# Patient Record
Sex: Female | Born: 1995 | Race: White | Hispanic: No | Marital: Single | Smoking: Never smoker
Health system: Southern US, Community
[De-identification: ages and names within clinical notes are randomized; demographics above are authoritative.]

## PROBLEM LIST (undated history)

## (undated) DIAGNOSIS — G9389 Other specified disorders of brain: Secondary | ICD-10-CM

## (undated) DIAGNOSIS — R569 Unspecified convulsions: Secondary | ICD-10-CM

## (undated) DIAGNOSIS — G43909 Migraine, unspecified, not intractable, without status migrainosus: Secondary | ICD-10-CM

## (undated) HISTORY — PX: NO PAST SURGERIES: SHX2092

---
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
## 2017-01-27 ENCOUNTER — Emergency Department (HOSPITAL_COMMUNITY)
Admission: EM | Admit: 2017-01-27 | Discharge: 2017-01-27 | Disposition: A | Discharge status: Home / Self Care | Payer: Medicaid Other | Attending: Emergency Medicine | Admitting: Emergency Medicine

## 2017-01-27 ENCOUNTER — Emergency Department (HOSPITAL_COMMUNITY): Payer: Medicaid Other

## 2017-01-27 ENCOUNTER — Encounter (HOSPITAL_COMMUNITY): Payer: Self-pay | Admitting: Emergency Medicine

## 2017-01-27 DIAGNOSIS — Z79899 Other long term (current) drug therapy: Secondary | ICD-10-CM | POA: Diagnosis not present

## 2017-01-27 DIAGNOSIS — Z7722 Contact with and (suspected) exposure to environmental tobacco smoke (acute) (chronic): Secondary | ICD-10-CM | POA: Diagnosis not present

## 2017-01-27 DIAGNOSIS — R569 Unspecified convulsions: Secondary | ICD-10-CM | POA: Insufficient documentation

## 2017-01-27 MED ORDER — TOPIRAMATE 25 MG PO TABS
150.0000 mg | ORAL_TABLET | Freq: Once | ORAL | Status: AC
  Administered 2017-01-27: 150 mg via ORAL
  Filled 2017-01-27: qty 1

## 2017-01-27 MED ORDER — TOPIRAMATE ER 150 MG PO SPRINKLE CAP24: 150.0000 mg | EXTENDED_RELEASE_CAPSULE | Freq: Two times a day (BID) | ORAL | 1 refills | Status: DC

## 2017-01-27 MED ORDER — LORAZEPAM 1 MG PO TABS
1.0000 mg | ORAL_TABLET | Freq: Once | ORAL | Status: AC
  Administered 2017-01-27: 1 mg via ORAL
  Filled 2017-01-27: qty 1

## 2017-01-27 MED ORDER — ONDANSETRON HCL 4 MG/2ML IJ SOLN
4.0000 mg | Freq: Once | INTRAMUSCULAR | Status: DC
  Filled 2017-01-27: qty 2

## 2017-01-27 MED ORDER — ONDANSETRON 4 MG PO TBDP
4.0000 mg | ORAL_TABLET | Freq: Once | ORAL | Status: AC
  Administered 2017-01-27: 4 mg via ORAL
  Filled 2017-01-27: qty 1

## 2017-01-27 MED ORDER — IBUPROFEN 200 MG PO TABS
400.0000 mg | ORAL_TABLET | Freq: Once | ORAL | Status: AC
  Administered 2017-01-27: 400 mg via ORAL
  Filled 2017-01-27: qty 2

## 2017-01-27 MED ORDER — LORAZEPAM 2 MG/ML IJ SOLN
1.0000 mg | INTRAMUSCULAR | Status: DC | PRN
  Filled 2017-01-27: qty 1

## 2017-01-27 NOTE — Discharge Instructions (Signed)
Continue current medications. 

## 2017-01-27 NOTE — ED Notes (Signed)
Pt wheeled to vehicle.  Ambulatory and independent at discharge.  Verbalized understanding of discharge instructions.   

## 2017-01-27 NOTE — ED Notes (Signed)
2 IV start attempts made by RN

## 2017-01-27 NOTE — ED Triage Notes (Signed)
Per EMS, pt c/o 1 grand-mal type seizure lasting 2-3 minutes about 1 hour ago, followed by mild post-ictal confusion. 5 mg Versed administered IM by family ended seizure. Struck head during fall, c/o headache now, 3 episodes emesis today. Takes levitracitam, which is new prescription. Also takes  Qudexy SR for seizures, which she has been out of and not taking x 3-4 days. Hx seizures, last seizure about 1 year ago.

## 2017-01-27 NOTE — ED Notes (Signed)
Patient transported to CT 

## 2017-01-27 NOTE — ED Provider Notes (Signed)
[facility]-EMERGENCY DEPT Provider Note   CSN: [id number] Arrival date & time: 01/27/17  1057     History   Chief Complaint Chief Complaint  Patient presents with  . Seizures    HPI Tamara [name] is a 21 y.o. female.  Chief complaint is seizure  HPI:  This is a 21 year old female with history of seizure disorder. Has been out of her topiramate for the last 4-5 days Pradaxa states she has it, but just hasn't restarted it. Also history of Keppra use which she has been compliant with. Had a seizure this morning. Larey SeatFell and hit her head. Also has history of AMI clonus that has improved slowly through the morning. This was present this morning and caused her to fall and hit her head prior to her seizure. Her myoclonus has improved.  Past Medical History:  Diagnosis Date  . Brain mass   . Migraines   . Seizures [facility](HCC)     Patient Active Problem List   Diagnosis Date Noted  . 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 Migraine without aura and without status migrainosus, not intractable 08/29/2014  . Episodic tension-type headache 08/29/2014  . Single epileptic seizure (HCC) 08/29/2014  . Myoclonus 08/29/2014  . Arnold-Chiari malformation, type I (HCC) 07/24/2014  . Generalized headaches 07/24/2014  . Vertigo of central origin 07/24/2014  . Obesity 07/24/2014    History reviewed. No pertinent surgical history.  OB History    No data available       Home Medications    Prior to Admission medications   Medication Sig Start Date End Date Taking? Authorizing Provider  levETIRAcetam (KEPPRA) 250 MG tablet Take 1 tablet twice daily 10/22/16   Deetta PerlaHickling, William H, MD  magic mouthwash SOLN Take 5 mLs by mouth 3 (three) times daily as needed for mouth pain. 03/20/15   Deetta PerlaHickling, William H, MD  midazolam (VERSED) 5 MG/ML injection Place 2 mLs (10 mg total) into the nose once. Draw up 1 mL each in 2 syringes, remove syringe from the  blue vial access device, then attach syringe to nasal atomizer for intranasal administration. Give 1 mL in each nostril for seizures lasting 2 minutes or longer. 03/20/15   Deetta PerlaHickling, William H, MD  SUMAtriptan (IMITREX) 25 MG tablet Take one tablet at onset of migraine; May repeat in 2 hours if headache persists or recurs. 02/12/16   Deetta PerlaHickling, William H, MD  Topiramate ER (QUDEXY XR) 150 MG CS24 Take 150 mg by mouth 2 (two) times daily. 01/27/17   Rolland PorterJames, Perley Arthurs, MD    Family History Family History  Problem Relation Age of Onset  . Heart attack Maternal Grandfather        Died at 4263  . Cancer Paternal Grandmother        Age at time of death is unknown    Social History Social History  Substance Use Topics  . Smoking status: Passive Smoke Exposure - Never Smoker  . Smokeless tobacco: Never Used  . Alcohol use No     Allergies   Patient has no known allergies.   Review of Systems Review of Systems  Constitutional: Negative for appetite change, chills, diaphoresis, fatigue and fever.  HENT: Negative for mouth sores, sore throat and trouble swallowing.   Eyes: Negative for visual disturbance.  Respiratory: Negative for cough, chest tightness, shortness of breath and wheezing.   Cardiovascular: Negative for chest pain.  Gastrointestinal: Negative for abdominal distention, abdominal pain, diarrhea, nausea  and vomiting.  Endocrine: Negative for polydipsia, polyphagia and polyuria.  Genitourinary: Negative for dysuria, frequency and hematuria.  Musculoskeletal: Negative for gait problem.  Skin: Negative for color change, pallor and rash.  Neurological: Positive for seizures and headaches. Negative for dizziness, syncope and light-headedness.  Hematological: Does not bruise/bleed easily.  Psychiatric/Behavioral: Negative for behavioral problems and confusion.     Physical Exam Updated Vital Signs BP (!) 101/57 (BP Location: Left Arm)   Pulse 60   Temp (!) 97.5 F (36.4 C) (Oral)    Resp 16   LMP 01/26/2017   SpO2 100%   Physical Exam  Constitutional: She is oriented to person, place, and time. She appears well-developed and well-nourished. No distress.  HENT:  Head: Normocephalic.  Eyes: Conjunctivae are normal. Pupils are equal, round, and reactive to light. No scleral icterus.  Neck: Normal range of motion. Neck supple. No thyromegaly present.  Cardiovascular: Normal rate and regular rhythm.  Exam reveals no gallop and no friction rub.   No murmur heard. Pulmonary/Chest: Effort normal and breath sounds normal. No respiratory distress. She has no wheezes. She has no rales.  Abdominal: Soft. Bowel sounds are normal. She exhibits no distension. There is no tenderness. There is no rebound.  Musculoskeletal: Normal range of motion.  Neurological: She is alert and oriented to person, place, and time.  Awake and alert. Oriented. Normal cranial nerve exam. Normal gait. Normal strength and sensation.  Skin: Skin is warm and dry. No rash noted.  Psychiatric: She has a normal mood and affect. Her behavior is normal.     ED Treatments / Results  Labs (all labs ordered are listed, but only abnormal results are displayed) Labs Reviewed - No data to display  EKG  EKG Interpretation None       Radiology Ct Head Wo Contrast  Result Date: 01/27/2017 CLINICAL DATA:  Grand mal seizure 1 hour ago, postictal. Struck head during fall. Headache and vomiting. History of seizures and brain mass. EXAM: CT HEAD WITHOUT CONTRAST TECHNIQUE: Contiguous axial images were obtained from the base of the skull through the vertex without intravenous contrast. COMPARISON:  MRI of the head August 16, 2016 FINDINGS: BRAIN: No intraparenchymal hemorrhage, mass effect nor midline shift. The ventricles and sulci are normal. No acute large vascular territory infarcts. No abnormal extra-axial fluid collections. Basal cisterns are patent. Minimal cerebellar tonsillar ectopia, similar to prior  examination. VASCULAR: Unremarkable. SKULL/SOFT TISSUES: No skull fracture. No significant soft tissue swelling. ORBITS/SINUSES: The included ocular globes and orbital contents are normal.The mastoid aircells and included paranasal sinuses are well-aerated. OTHER: None. IMPRESSION: 1. No acute intracranial process. 2. Minimal cerebellar tonsillar ectopia ; otherwise negative noncontrast CT HEAD. Electronically Signed   By: Awilda Metro M.D.   On: 01/27/2017 13:36    Procedures Procedures (including critical care time)  Medications Ordered in ED Medications  ibuprofen (ADVIL,MOTRIN) tablet 400 mg (400 mg Oral Given 01/27/17 1249)  topiramate (TOPAMAX) tablet 150 mg (150 mg Oral Given 01/27/17 1249)  ondansetron (ZOFRAN-ODT) disintegrating tablet 4 mg (4 mg Oral Given 01/27/17 1403)  LORazepam (ATIVAN) tablet 1 mg (1 mg Oral Given 01/27/17 1402)     Initial Impression / Assessment and Plan / ED Course  I have reviewed the triage vital signs and the nursing notes.  Pertinent labs & imaging results that were available during my care of the patient were reviewed by me and considered in my medical decision making (see chart for details).    CT scan  shows no acute findings. Labs reassuring. Given her dose of her topiramate here. Given 1 mg Ativan for seizure protection today. Continue/resume her topiramate. F/U with her neurologist.  Final Clinical Impressions(s) / ED Diagnoses   Final diagnoses:  Seizure (HCC)    New Prescriptions New Prescriptions   TOPIRAMATE ER (QUDEXY XR) 150 MG CS24    Take 150 mg by mouth 2 (two) times daily.     Rolland Porter, MD 01/27/17 [phone]

## 2017-03-03 IMAGING — MR MR HEAD W/O CM
9 series · 48 of 48 positions shown · non-contrast
Comparison: 07/28/2014 MRI of the head.

CLINICAL DATA: 20 y/o F; history of seizures, headaches with neck
pain, and involuntary jerking since [DATE].

EXAM:
MRI HEAD WITHOUT CONTRAST
TECHNIQUE: Multiplanar, multiecho pulse sequences of the brain and surrounding
structures were obtained without intravenous contrast.

[Series 5: T1 · sagittal · 4.0mm · 0.75mm/px · 2 of 26 slices shown (1 of 2)]
[im 1/26]
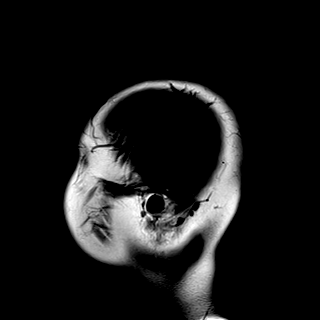
[im 26/26]
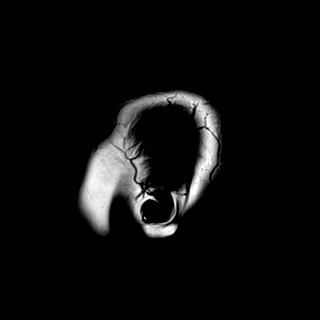

[Series 6: DWI · axial · 3.0mm · 1.44mm/px · z∈[-83,+51]mm · 8 of 84 slices shown (1 of 2)]
[im 1/84]
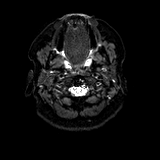
[im 12/84]
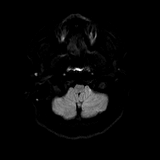
[im 24/84]
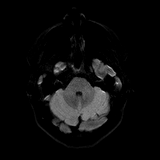
[im 36/84]
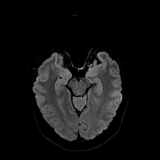
[im 48/84]
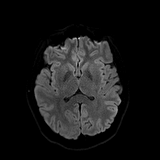
[im 60/84]
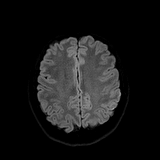
[im 72/84]
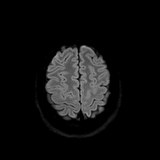
[im 84/84]
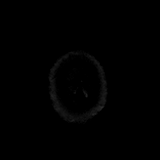

[Series 7: DWI · axial · 3.0mm · 1.44mm/px · z∈[-83,+51]mm · 4 of 42 slices shown (2 of 2)]
[im 1/42]
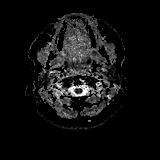
[im 14/42]
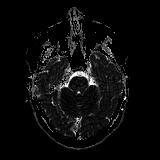
[im 28/42]
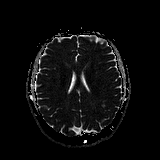
[im 42/42]
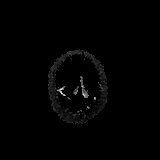

[Series 8: T2 · axial · 4.0mm · 0.36mm/px · z∈[-85,+54]mm · 3 of 28 slices shown (1 of 3)]
[im 1/28]
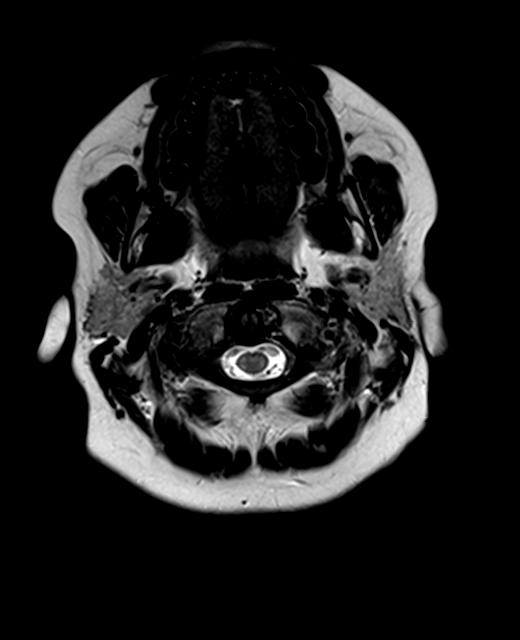
[im 14/28]
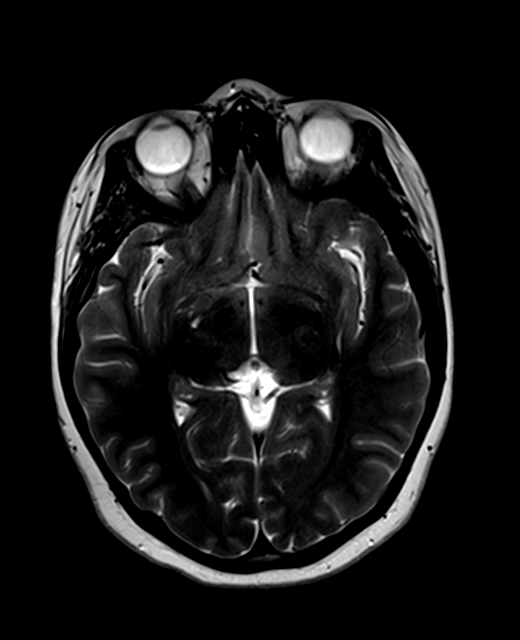
[im 28/28]
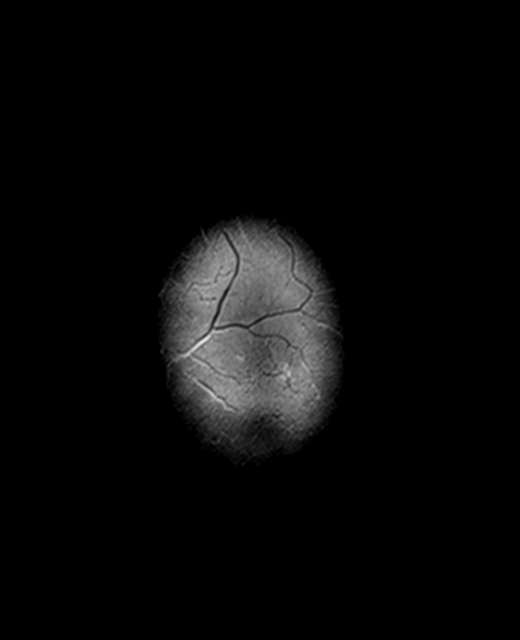

[Series 9: FLAIR · axial · 4.0mm · 0.72mm/px · z∈[-87,+53]mm · 3 of 28 slices shown]
[im 1/28]
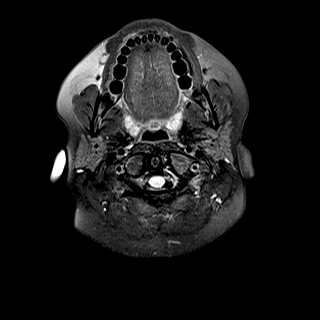
[im 14/28]
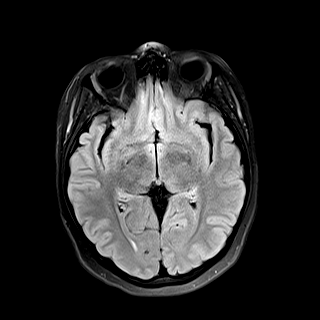
[im 28/28]
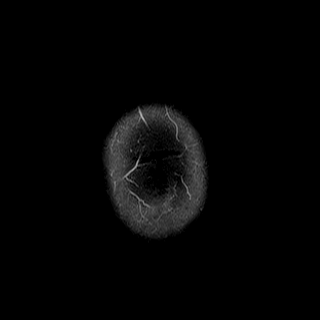

[Series 11: swi_images · axial · 1.5mm · 0.90mm/px · z∈[-86,+55]mm · 9 of 96 slices shown]
[im 1/96]
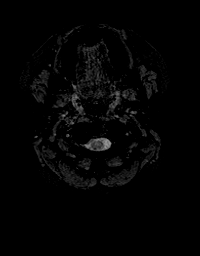
[im 12/96]
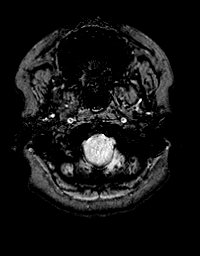
[im 24/96]
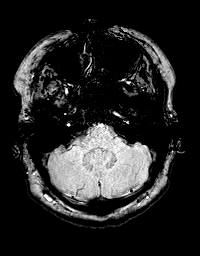
[im 36/96]
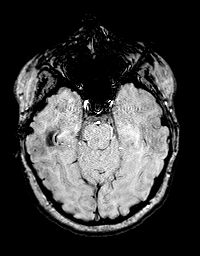
[im 48/96]
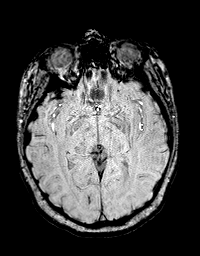
[im 60/96]
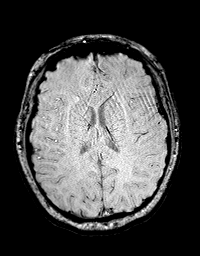
[im 72/96]
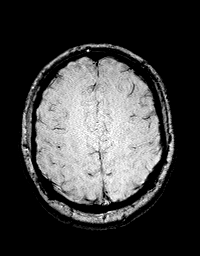
[im 84/96]
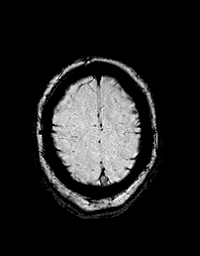
[im 96/96]
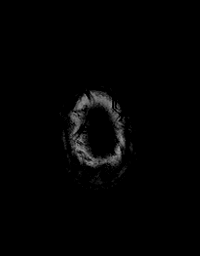

[Series 12: T1 · axial · 1.0mm · 0.90mm/px · z∈[-88,+55]mm · 14 of 144 slices shown (2 of 2)]
[im 1/144]
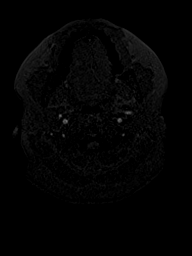
[im 12/144]
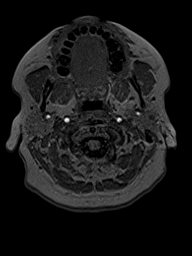
[im 23/144]
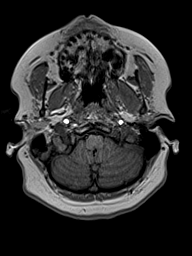
[im 34/144]
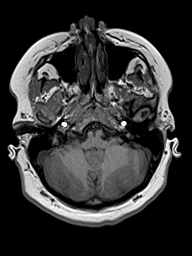
[im 45/144]
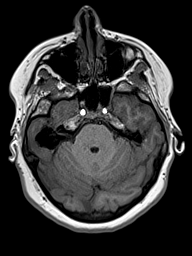
[im 56/144]
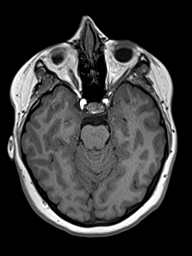
[im 67/144]
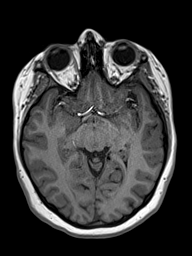
[im 78/144]
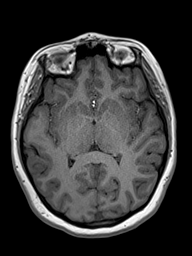
[im 89/144]
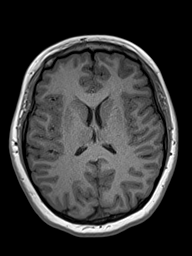
[im 100/144]
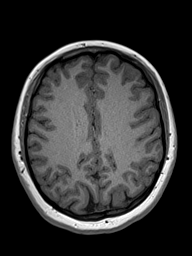
[im 111/144]
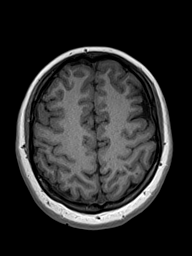
[im 122/144]
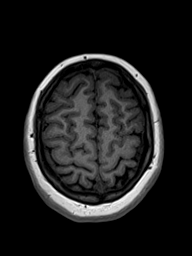
[im 133/144]
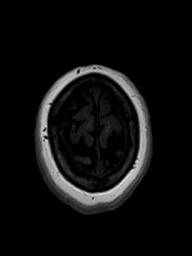
[im 144/144]
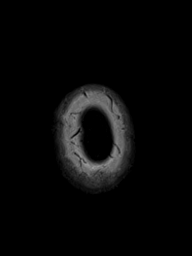

[Series 13: T2 · coronal · 4.5mm · 0.36mm/px · 3 of 30 slices shown (2 of 3)]
[im 1/30]
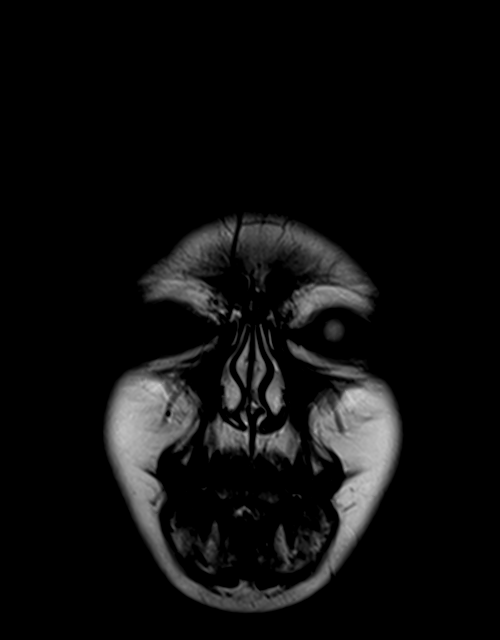
[im 15/30]
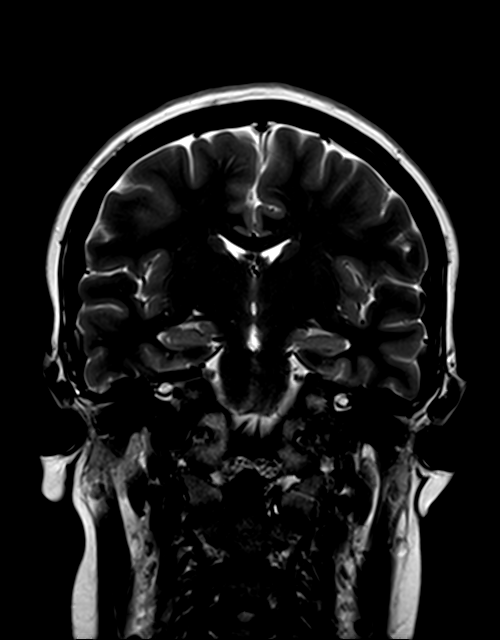
[im 30/30]
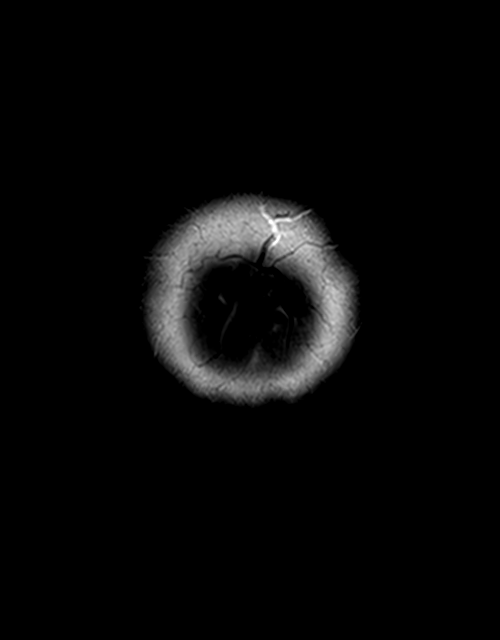

[Series 14: T2 · coronal · 3.0mm · 0.47mm/px · 2 of 25 slices shown (3 of 3)]
[im 1/25]
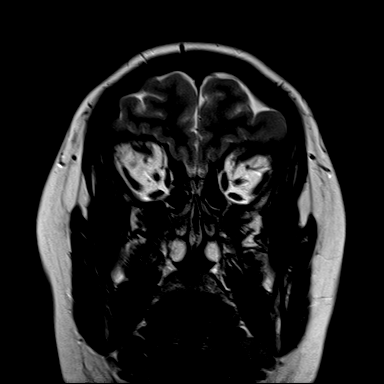
[im 25/25]
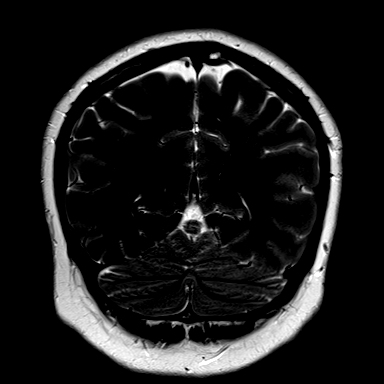

[48 of 48 positions shown; findings below may reference images not displayed]

FINDINGS: Brain: Cerebellar tonsillar ectopia of 4.6 mm, previously 3.2 mm
measured in a similar fashion. This is stable to minimally increased
given differences in slice selection. The tonsils have a rounded and
not pointed appearance and there is no hydrocephalus or mass effect
at the foramen magnum.

No diffusion signal abnormality. No extra-axial collection,
hydrocephalus, focal mass effect, or hemorrhage. No significant T2
FLAIR signal abnormality. No disorder cortical formation,
heterotopia, or structural abnormality is identified. Hippocampi by
are symmetric in size and signal bilaterally. Complete corpus
callosum and vermis.

Vascular: Normal flow voids.

Skull and upper cervical spine: Normal marrow signal.

Sinuses/Orbits: Negative.

Other: None.
IMPRESSION: 1. 4-5 mm tonsillar ectopia, previously measuring 3 mm. This is
stable to minimally increased given differences in slice selection.
No significant mass effect at the foramen magnum. No hydrocephalus.
2. Otherwise normal MRI of the brain.
3. No structural cause of seizure identified.

By: Noriel Noriel M.D.

## 2017-03-31 ENCOUNTER — Ambulatory Visit (INDEPENDENT_AMBULATORY_CARE_PROVIDER_SITE_OTHER): Payer: Medicaid Other | Admitting: Pediatrics

## 2017-03-31 ENCOUNTER — Encounter (INDEPENDENT_AMBULATORY_CARE_PROVIDER_SITE_OTHER): Payer: Self-pay | Admitting: Pediatrics

## 2017-03-31 VITALS — BP 110/90 | HR 60 | Ht 67.5 in | Wt 203.0 lb

## 2017-03-31 DIAGNOSIS — G44219 Episodic tension-type headache, not intractable: Secondary | ICD-10-CM

## 2017-03-31 DIAGNOSIS — G253 Myoclonus: Secondary | ICD-10-CM | POA: Diagnosis not present

## 2017-03-31 DIAGNOSIS — Q048 Other specified congenital malformations of brain: Secondary | ICD-10-CM

## 2017-03-31 DIAGNOSIS — G40309 Generalized idiopathic epilepsy and epileptic syndromes, not intractable, without status epilepticus: Secondary | ICD-10-CM

## 2017-03-31 DIAGNOSIS — G43009 Migraine without aura, not intractable, without status migrainosus: Secondary | ICD-10-CM

## 2017-03-31 MED ORDER — QUDEXY XR 100 MG PO CS24: 1.0000 | EXTENDED_RELEASE_CAPSULE | Freq: Two times a day (BID) | ORAL | 5 refills | Status: DC

## 2017-03-31 MED ORDER — LEVETIRACETAM 250 MG PO TABS: ORAL_TABLET | ORAL | 5 refills | Status: DC

## 2017-03-31 MED ORDER — QUDEXY XR 50 MG PO CS24: EXTENDED_RELEASE_CAPSULE | ORAL | 5 refills | Status: DC

## 2017-03-31 NOTE — Progress Notes (Deleted)
HPI: .      Plan:       

## 2017-03-31 NOTE — Patient Instructions (Signed)
Currently you take Qudexy 50 mg and 100 mg in the morning and evening.  In order to try to move to one dose per day I want you to try the following:  Take a total 100 mg in the morning and 200 mg at nighttime.  I would do this by continuing 100 mg in the morning and taking 100 mg plus 2 -  50 mg at nighttime for a week.  If things go well, then I would take 50 mg in the morning, and 250 mg at nighttime using 2-100 mg tablets and one 50 mg tablet.  Finally if things go well you can take 2 100 mg tablets and 2 50 mg tablets at bedtime.  Once he use up the 50s, I will switch to 3 100 mg tablets , or figure out another combination that will equal 300.  Please send me your calendars that have been completed.  Send me September calendar at the end of the month.  I'm very proud of you that you have lost weight again.  Please keep up the good work with wise traces in food, curbing your intake, and physical activity.

## 2017-03-31 NOTE — Progress Notes (Signed)
Patient: Tamara [name] MRN: [id number] Sex: female DOB: [birth year]  Provider: Ellison CarwinWilliam Merlin Golden, MD [facility]  Note type: Routine return visit  History of Present Illness: Referral Source: Rowan BlaseMeghan Hall, MD History from: mother, patient and [facility] chart Chief Complaint: Headaches/Epilepsy  Tamara BarbaraChelsea M [name] is a 21 y.o. female who returns for evaluation of seizures, migraines, and myoclonus.  She notes her last seizure was around July 10. She drank some of her sister-in-laws alcoholic drink the day before and was getting ready to take her medication the next morning but felt jerky then became dizzy and had a tonic clonic seizure. Mom provided a dose of midazolam nasally. The seizure lasted about 3 minutes. She currently notes she is not having much myoclonus at this time.  [name] is having 2-3 headaches a week, she describes as medium and none have forced her to bed. She is currently completing high school classes online to finish her high school diploma. She hopes to complete this in 2019. She would like to switch her topiramate extended release to once daily if possible to simplify her daily medication regimen. Mom notes since starting keppra, she has noted some increased bruising but she is not sure if this related to the medication. Pt notes she has lost seven pounds since last visit by drinking more water.   Review of Systems: 12 system review was remarkable for 2 to 3 headaches a week, one seizure since last visit, ED visit; remainder was assessed and was negative  Past Medical History Diagnosis Date  . Brain mass   . Migraines   . Seizures (HCC)    Hospitalizations: No., Head Injury: No., Nervous System Infections: No., Immunizations up to date: Yes.    CT scan of the brain suggested that she had low-lying cerebellar tonsils. 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 The tonsils were  rounded and present no long-term risk to this patient. There is no crowding in the posterior fossa as result of this cerebellar configuration.  August 05, 2014 She felt her left arm going up to her chest and was unable to put it back down and then had no memory for other events. Her brother and mother witnessed generalized tonic-clonic seizure activity with stiffening of her upper extremities or head going back, eyes rolling upwards lasting 2 to 3 minutes with resolution. In the aftermath, she was groggy and postictal. In the emergency department, she had a normal neurological examination and recovered her cognitive abilities. She had a normal EKG.   EEG performed August 07, 2014, was an essentially normal record with the patient awake and drowsy, dominant frequency with lower limits of normal. She had a normal background activity with drowsiness and no evidence of interictal activity.  An EEG was performed on September 13, 2015 and was a normal study in the waking state and drowsy.   She was averaging 3 or 4 migraines per month on the calendars sent to [place] since her last visit.  Birth History 8 lbs. 0 oz. infant born at 8040 weeks gestational age to a 19 year old g 2 p 1 0 0 1 female. Gestation was uncomplicated Mother received Epidural anesthesia  Normal spontaneous vaginal delivery Nursery Course was uncomplicated Growth and Development was recalled as normal  Behavior History none  Surgical History History reviewed. No pertinent surgical history.  Family History family history includes Cancer in her paternal grandmother; Heart attack  in her maternal grandfather. Family history is negative for migraines, seizures, intellectual disabilities, blindness, deafness, birth defects, chromosomal disorder, or autism.  Social History . Marital status: Single  . Years of education: 98   Social History Main Topics  . Smoking status: Passive Smoke Exposure - Never Smoker  .  Smokeless tobacco: Never Used  . Alcohol use No  . Drug use: No  . 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    She attends [employer] at home.     She lives with her parents. She has two brothers.    She enjoys listening to music and reading.   No Known Allergies  Physical Exam BP 110/90   Pulse 60   Ht 5' 7.5" (1.715 m)   Wt 203 lb (92.1 kg)   BMI 31.33 kg/m   General: alert, well developed, well nourished, in no acute distress, brown hair, green eyes, right handed Head: normocephalic, no dysmorphic features Ears, Nose and Throat: Otoscopic: tympanic membranes normal; pharynx: oropharynx is pink without exudates or tonsillar hypertrophy Neck: supple, full range of motion, no cranial or cervical bruits Respiratory: auscultation clear Cardiovascular: no murmurs, pulses are normal Musculoskeletal: no skeletal deformities or apparent scoliosis Skin: no rashes or neurocutaneous lesions  Neurologic Exam  Mental Status: alert; oriented to person, place and year; knowledge is normal for age; language is normal Cranial Nerves: visual fields are full to double simultaneous stimuli; extraocular movements are full and conjugate; pupils are round reactive to light; funduscopic examination shows sharp disc margins with normal vessels; symmetric facial strength; midline tongue and uvula; air conduction is greater than bone conduction bilaterally Motor: Normal strength, tone and mass; good fine motor movements; no pronator drift Sensory: intact responses to cold, vibration, proprioception and stereognosis Coordination: good finger-to-nose, rapid repetitive alternating movements and finger apposition Gait and Station: normal gait and station: patient is able to walk on heels, toes and tandem without difficulty; balance is adequate; Romberg exam is negative; Gower response is negative Reflexes: symmetric and diminished bilaterally; no  clonus; bilateral flexor plantar responses  Assessment 1.  Migraine without aura and without status migrant is, not intractable, G43.009. 2.  Episodic tension type headache, not intractable, G44.219. 3.  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 4.  Myoclonus, G25.3. 5.  Epilepsy, generalized, convulsive, G40.309  Discussion I'm concerned that [facility] had a generalized seizure.  She is not able to drive or even getting learners permit until she is been seizure free for 6 months.  I don't think that drinking alcohol had anything to do with this.  He did appear that she was having a crescendo myoclonus leading up to her generalized seizure which is not uncommon.  I am pleased that her headaches have improved.  I want to try to adjust her topiramate extended release so that she only has to take it once a day and have suggested a transition.  Her cerebellar tonsillar ectopia has nothing to do with her headaches and is not a progressive problem.  Plan - Start topiramate dosing schedule to move toward  once daily. If symptoms worsen go back to  BID. - Continue to take levetiracetam without change unless she has further seizures.  The dose is so low that we can easily increase it - Send headache calendars  - After six months seizure free we can work on learners permit process   Medication List   Accurate as of 03/31/17  9:23  PM.      levETIRAcetam 250 MG tablet Commonly known as:  KEPPRA Take 1 tablet twice daily   magic mouthwash Soln Take 5 mLs by mouth 3 (three) times daily as needed for mouth pain.   midazolam 5 MG/ML injection Commonly known as:  VERSED Place 2 mLs (10 mg total) into the nose once. Draw up 1 mL each in 2 syringes, remove syringe from the blue vial access device, then attach syringe to nasal atomizer for intranasal administration. Give 1 mL in each nostril for seizures lasting 2 minutes or longer.   QUDEXY XR 100 MG Cs24 sprinkle capsule Generic drug:   Topiramate ER Take 1 capsule by mouth 2 (two) times daily.   QUDEXY XR 50 MG Cs24 sprinkle capsule Generic drug:  Topiramate ER Take 1 tablet twice daily   SUMAtriptan 25 MG tablet Commonly known as:  IMITREX Take one tablet at onset of migraine; May repeat in 2 hours if headache persists or recurs.     Discharge Care Instructions        Start     Ordered   03/31/17 0000  levETIRAcetam (KEPPRA) 250 MG tablet     03/31/17 1220   03/31/17 0000  QUDEXY XR 100 MG CS24 sprinkle capsule  2 times daily     03/31/17 1220   03/31/17 0000  QUDEXY XR 50 MG CS24 sprinkle capsule     03/31/17 1220    The medication list was reviewed and reconciled. All changes or newly prescribed medications were explained.  A complete medication list was provided to the patient/caregiver.  Ejiofor Lucky Cowboy. MD PhD Resident PGY1  [facility]   30 minutes of face-to-face time was spent with [facility] and her mother.  Dr. Briant Sites acted as a Neurosurgeon.  I performed physical examination, history taking, discussed my impressions and make recommendations concerning treatment headaches and migraines and directed decision making.  Deetta Perla MD

## 2017-04-01 ENCOUNTER — Encounter (INDEPENDENT_AMBULATORY_CARE_PROVIDER_SITE_OTHER): Payer: Self-pay | Admitting: Pediatrics

## 2017-04-19 ENCOUNTER — Encounter (INDEPENDENT_AMBULATORY_CARE_PROVIDER_SITE_OTHER): Payer: Self-pay | Admitting: Pediatrics

## 2017-04-19 NOTE — Telephone Encounter (Signed)
Headache calendar from September 2018 on [place]. 30 days were recorded.  23 days were headache free.  7 days were associated with tension type headaches, 1 required treatment.  There were no days of migraines.  There is no reason to change current treatment.  I will contact the family.

## 2017-05-06 ENCOUNTER — Encounter (INDEPENDENT_AMBULATORY_CARE_PROVIDER_SITE_OTHER): Payer: Self-pay | Admitting: Pediatrics

## 2017-05-22 ENCOUNTER — Encounter (INDEPENDENT_AMBULATORY_CARE_PROVIDER_SITE_OTHER): Payer: Self-pay | Admitting: Pediatrics

## 2017-05-25 NOTE — Telephone Encounter (Signed)
Headache calendar from October 2018 on Tamara [name]. 31 days were recorded.  23 days were headache free.  8 days were associated with tension type headaches, 1 required treatment.  There were no days of migraines.  There is no reason to change current treatment.  I will contact the family.

## 2017-06-20 ENCOUNTER — Encounter (INDEPENDENT_AMBULATORY_CARE_PROVIDER_SITE_OTHER): Payer: Self-pay | Admitting: Pediatrics

## 2017-06-27 NOTE — Telephone Encounter (Signed)
Headache calendar from November 2018 on Tamara [name]. 30 days were recorded.  25 days were headache free.  5 days were associated with tension type headaches, 1 required treatment.  There were no days of migraines.  There is no reason to change current treatment.  I will contact the family.

## 2017-07-20 ENCOUNTER — Encounter (INDEPENDENT_AMBULATORY_CARE_PROVIDER_SITE_OTHER): Payer: Self-pay | Admitting: Pediatrics

## 2017-07-21 NOTE — Telephone Encounter (Signed)
Headache calendar from December 2018 on Tamara [name]. 31 days were recorded.  25 days were headache free.  6 days were associated with tension type headaches, 2 required treatment.  There were no days of migraines.  There is no reason to change current treatment.  I will contact the family.

## 2017-07-30 ENCOUNTER — Ambulatory Visit (INDEPENDENT_AMBULATORY_CARE_PROVIDER_SITE_OTHER): Payer: Medicaid Other | Admitting: Pediatrics

## 2017-07-30 ENCOUNTER — Encounter (INDEPENDENT_AMBULATORY_CARE_PROVIDER_SITE_OTHER): Payer: Self-pay | Admitting: Pediatrics

## 2017-07-30 VITALS — BP 112/72 | HR 60 | Ht 66.5 in | Wt 208.4 lb

## 2017-07-30 DIAGNOSIS — G40309 Generalized idiopathic epilepsy and epileptic syndromes, not intractable, without status epilepticus: Secondary | ICD-10-CM | POA: Diagnosis not present

## 2017-07-30 DIAGNOSIS — G43009 Migraine without aura, not intractable, without status migrainosus: Secondary | ICD-10-CM | POA: Diagnosis not present

## 2017-07-30 DIAGNOSIS — G44219 Episodic tension-type headache, not intractable: Secondary | ICD-10-CM | POA: Diagnosis not present

## 2017-07-30 DIAGNOSIS — G253 Myoclonus: Secondary | ICD-10-CM | POA: Diagnosis not present

## 2017-07-30 NOTE — Progress Notes (Signed)
Patient: Tamara [name] MRN: [id number] Sex: female DOB: 1996-05-07  Provider: Ellison Carwin, MD Location of Care: [name]  Note type: Routine return visit  History of Present Illness: Referral Source: Tamara Blase, MD History from: mother, patient and Tamara [name] chart Chief Complaint: Headaches/Epilepsy  Tamara [name] is a 22 y.o. female who returns on July 30, 2017, for the first time since March 31, 2017.  She has a history of generalized convulsive epilepsy, myoclonus, migraine without aura, and episodic tension-type headaches.  She also had morbid obesity but has lost a tremendous amount of weight which has recently plateaued.  Tamara [name] did not bring her headache calendar but said she is averaging 2 to 3 migraines per month.  She has had no seizures and had no myoclonus.  She has been compliant with her antiepileptic medication.  She takes low-dose levetiracetam which has worked extremely well and Qudexy which has also worked well for her headaches.  She is in her 12th grade year taking online classes at home but does not think that she will complete her courses by the end of this academic semester.  Her intention is to take online courses in community college as well.  We have not had to increase topiramate beyond 200 mg which is good.  She asked today about learner's permit, and I told her that if she presented one to me that I would fill it out, and there was a fairly good chance that she would be able to obtain a learner's permit license.  Her health is good.  She has gained only 5 pounds since her last visit, which is not much in 4 months.  This is particularly so given that we just finished holidays.  Review of Systems: A complete review of systems was assessed and was negative.  Past Medical History Diagnosis Date  . Brain mass   . Migraines   . Seizures (HCC)    Hospitalizations: No., Head Injury: No., Nervous System Infections: No., Immunizations up  to date: Yes.    CT scan of the brain suggested that she had low-lying cerebellar tonsils. 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 The tonsils were rounded and present no long-term risk to this patient. There is no crowding in the posterior fossa as result of this cerebellar configuration.  August 05, 2014 She felt her left arm going up to her chest and was unable to put it back down and then had no memory for other events. Her brother and mother witnessed generalized tonic-clonic seizure activity with stiffening of her upper extremities or head going back, eyes rolling upwards lasting 2 to 3 minutes with resolution. In the aftermath, she was groggy and postictal. In the emergency department, she had a normal neurological examination and recovered her cognitive abilities. She had a normal EKG.   EEG performed August 07, 2014, was an essentially normal record with the patient awake and drowsy, dominant frequency with lower limits of normal. She had a normal background activity with drowsiness and no evidence of interictal activity.  An EEG was performed on September 13, 2015 and was a normal study in the waking state and drowsy.   She was averaging 3 or 4 migraines per month on the calendars sent to [place] since her last visit.  Birth History 8 lbs. 0 oz. infant born at [redacted] weeks gestational age to a 20 year old g 2 p 1 0 0 1  female. Gestation was uncomplicated Mother received Epidural anesthesia  Normal spontaneous vaginal delivery Nursery Course was uncomplicated Growth and Development was recalled as normal  Behavior History none  Surgical History Procedure Laterality Date  . NO PAST SURGERIES     Family History family history includes Cancer in her paternal grandmother; Heart attack in her maternal grandfather. Family history is negative for migraines, seizures, intellectual disabilities, blindness, deafness,  birth defects, chromosomal disorder, or autism.  Social History Social History   Socioeconomic History  . Marital status: Single  . Years of education: 47  . Highest education level:  High School  Social Needs  . Financial resource strain: None  . Food insecurity - worry: None  . Food insecurity - inability: None  . Transportation needs - medical: None  . Transportation needs - non-medical: None  Tobacco Use  . Smoking status: Passive Smoke Exposure - Never Smoker  . Smokeless tobacco: Never Used  Substance and Sexual Activity  . Alcohol use: No    Alcohol/week: 0.0 oz  . Drug use: No  . 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    She attends [employer] at home.     She lives with her parents. She has two brothers.    She enjoys listening to music and reading.   No Known Allergies  Physical Exam BP 112/72   Pulse 60   Ht 5' 6.5" (1.689 m)   Wt 208 lb 6.4 oz (94.5 kg)   BMI 33.13 kg/m   General: alert, well developed, obese in no acute distress, brown hair, hazel eyes, right handed Head: normocephalic, no dysmorphic features Ears, Nose and Throat: Otoscopic: tympanic membranes normal; pharynx: oropharynx is pink without exudates or tonsillar hypertrophy Neck: supple, full range of motion, no cranial or cervical bruits Respiratory: auscultation clear Cardiovascular: no murmurs, pulses are normal Musculoskeletal: no skeletal deformities or apparent scoliosis Skin: no rashes or neurocutaneous lesions  Neurologic Exam  Mental Status: alert; oriented to person, place and year; knowledge is normal for age; language is normal Cranial Nerves: visual fields are full to double simultaneous stimuli; extraocular movements are full and conjugate; pupils are round reactive to light; funduscopic examination shows sharp disc margins with normal vessels; symmetric facial strength; midline tongue and uvula; air conduction is  greater than bone conduction bilaterally Motor: Normal strength, tone and mass; good fine motor movements; no pronator drift Sensory: intact responses to cold, vibration, proprioception and stereognosis Coordination: good finger-to-nose, rapid repetitive alternating movements and finger apposition Gait and Station: normal gait and station: patient is able to walk on heels, toes and tandem without difficulty; balance is adequate; Romberg exam is negative; Gower response is negative Reflexes: symmetric and diminished bilaterally; no clonus; bilateral flexor plantar responses  Assessment 1.  Migraine without aura without status migrainosus, not intractable, G43.009. 2.  Episodic tension type headache, not intractable, G44.219. 3.  Epilepsy, generalized, convulsive, G40.309. 4.  Myoclonus, G25.3.  Discussion [place] has been stable as regards her headaches and her seizures have been controlled.  She gained a slight amount of weight since last time, but overall is doing extremely well.  Plan I spent 30 minutes of face-to-face time with [facility] discussing her seizures, her headaches, and her weight.  More than half of this was in consultation.  She will return to see me in 6 months' time but will send her calendars to me on a monthly basis so that we remain  in touch throughout that time.  I did not need to refill her prescriptions today.   Medication List    Accurate as of 07/30/17  3:13 PM.      levETIRAcetam 250 MG tablet Commonly known as:  KEPPRA Take 1 tablet twice daily   magic mouthwash Soln Take 5 mLs by mouth 3 (three) times daily as needed for mouth pain.   midazolam 5 MG/ML injection Commonly known as:  VERSED Place 2 mLs (10 mg total) into the nose once. Draw up 1 mL each in 2 syringes, remove syringe from the blue vial access device, then attach syringe to nasal atomizer for intranasal administration. Give 1 mL in each nostril for seizures lasting 2 minutes or longer.     PROVENTIL HFA 108 (90 Base) MCG/ACT inhaler Generic drug:  albuterol INHALE 2 PUFFS EVERY 4 HOURS   QUDEXY XR 100 MG Cs24 sprinkle capsule Generic drug:  Topiramate ER Take 1 capsule by mouth 2 (two) times daily.   SUMAtriptan 25 MG tablet Commonly known as:  IMITREX Take one tablet at onset of migraine; May repeat in 2 hours if headache persists or recurs.    The medication list was reviewed and reconciled. All changes or newly prescribed medications were explained.  A complete medication list was provided to the patient/caregiver.  Deetta PerlaWilliam H Jon Kasparek MD

## 2017-07-30 NOTE — Patient Instructions (Signed)
Please get the Division of Motor Vehicles form for medical conditions to me.  I think that you can download it.  If not you may have to go to [facility].  Continue to take topiramate and levetiracetam.  Send your calendars to me for my review and I will respond to you.

## 2017-09-08 ENCOUNTER — Telehealth (INDEPENDENT_AMBULATORY_CARE_PROVIDER_SITE_OTHER): Payer: Self-pay | Admitting: Pediatrics

## 2017-09-08 NOTE — Telephone Encounter (Signed)
Pt came in requesting to have DMV forms completed by Provider; requested a call once forms completed and ready for pick up please. (fee already paid)  -Forms placed in Provider's basket at the front.  Pt: Tamara [name] [phone] mobile

## 2017-09-08 NOTE — Telephone Encounter (Signed)
Pt requested to have forms mail originals [place] [facility] [place]; please call for her to pick up a copy at mobile [phone](pt's Mom/Regina)

## 2017-09-09 ENCOUNTER — Encounter (INDEPENDENT_AMBULATORY_CARE_PROVIDER_SITE_OTHER): Payer: Self-pay | Admitting: Pediatrics

## 2017-09-09 NOTE — Telephone Encounter (Signed)
Thanks

## 2017-09-09 NOTE — Telephone Encounter (Signed)
Tried to l/m to inform mom and [name] that the [facility] forms were ready for pick up. I have placed the originals in an envelope to be mailed to [place].

## 2017-11-22 ENCOUNTER — Other Ambulatory Visit (INDEPENDENT_AMBULATORY_CARE_PROVIDER_SITE_OTHER): Payer: Self-pay | Admitting: Pediatrics

## 2017-11-22 DIAGNOSIS — G43009 Migraine without aura, not intractable, without status migrainosus: Secondary | ICD-10-CM

## 2018-01-27 ENCOUNTER — Ambulatory Visit (INDEPENDENT_AMBULATORY_CARE_PROVIDER_SITE_OTHER): Payer: Medicaid Other | Admitting: Pediatrics

## 2018-01-27 ENCOUNTER — Encounter (INDEPENDENT_AMBULATORY_CARE_PROVIDER_SITE_OTHER): Payer: Self-pay | Admitting: Pediatrics

## 2018-01-27 VITALS — BP 120/72 | HR 68 | Ht 67.0 in | Wt 216.2 lb

## 2018-01-27 DIAGNOSIS — G43009 Migraine without aura, not intractable, without status migrainosus: Secondary | ICD-10-CM

## 2018-01-27 DIAGNOSIS — G40309 Generalized idiopathic epilepsy and epileptic syndromes, not intractable, without status epilepticus: Secondary | ICD-10-CM | POA: Diagnosis not present

## 2018-01-27 DIAGNOSIS — G44219 Episodic tension-type headache, not intractable: Secondary | ICD-10-CM | POA: Diagnosis not present

## 2018-01-27 DIAGNOSIS — Z68.41 Body mass index (BMI) pediatric, greater than or equal to 95th percentile for age: Secondary | ICD-10-CM

## 2018-01-27 DIAGNOSIS — G253 Myoclonus: Secondary | ICD-10-CM | POA: Diagnosis not present

## 2018-01-27 DIAGNOSIS — Q048 Other specified congenital malformations of brain: Secondary | ICD-10-CM

## 2018-01-27 DIAGNOSIS — E6609 Other obesity due to excess calories: Secondary | ICD-10-CM

## 2018-01-27 MED ORDER — QUDEXY XR 100 MG PO CS24: EXTENDED_RELEASE_CAPSULE | ORAL | 5 refills | Status: DC

## 2018-01-27 MED ORDER — LEVETIRACETAM 250 MG PO TABS
ORAL_TABLET | ORAL | 5 refills | Status: DC
Start: 2018-01-27 — End: 2018-11-18

## 2018-01-27 NOTE — Patient Instructions (Signed)
I am pleased that your headaches are in such good control.  I have no problem trying to taper your Qudexy to see if your headaches will remain controlled on 100 mg a day.  If your headaches worsen, please let me know.  We will make no change in levetiracetam.  There is no reason that that you should be prevented from getting a driver's license.  If there are any concerns let me know.  There may be a medical form that I have to fill out.

## 2018-01-27 NOTE — Progress Notes (Signed)
Patient: Tamara [name] MRN: [id number] Sex: female DOB: 03/06/1996  Provider: Ellison Carwin, MD Location of Care: [facility]  Note type: Routine return visit  History of Present Illness: Referral Source: Tamara Blase, MD History from: mother, patient and [name] chart Chief Complaint: Headaches/Epilepsy  Tamara [name] is a 22 y.o. female who returns on January 27, 2018, for the first time since July 30, 2017.  [name] has a history of generalized convulsive epilepsy with myoclonus, migraine without aura, episodic tension-type headaches, and had morbid obesity but has lost a lot of weight.    She has been seizure-free.  There have been no migraines in 6 months.  She has 1 or 2 tension-type headaches a week.  Her general health is good.  She wants to drive.  I told her that because her seizures have been well controlled over a year, that there is no reason she could not as long as she took her antiepileptic medication.  She also wants to try to taper her Qudexy.  I told her that this was a perfect time to do it because of the prolonged period where she has had no migraines.  If she tapers Qudexy and her migraines return, then we can start it back at the original dose.  I thought she completed her 12th grade year, but this fall, she will be in the 12th grade, taking online courses from [employer].  She is making steady progress in school.  She is trying to get a job this summer in order to have something to do and have some spending money.  She has gained about 8 pounds since I saw her last time, which is the first time she has had weight gain in some time.  Review of Systems: A complete review of systems was remarkable for patient reports that she has one to two headaches a week. No seizures or migraines, all other systems reviewed and negative.  Past Medical History Diagnosis Date  . Brain mass   . Migraines   . Seizures (HCC)    Hospitalizations: No., Head Injury: No.,  Nervous System Infections: No., Immunizations up to date: Yes.    CT scan of the brain suggested that she had low-lying cerebellar tonsils. 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 The tonsils were rounded and present no long-term risk to this patient. There is no crowding in the posterior fossa as result of this cerebellar configuration.  August 05, 2014 She felt her left arm going up to her chest and was unable to put it back down and then had no memory for other events. Her brother and mother witnessed generalized tonic-clonic seizure activity with stiffening of her upper extremities or head going back, eyes rolling upwards lasting 2 to 3 minutes with resolution. In the aftermath, she was groggy and postictal. In the emergency department, she had a normal neurological examination and recovered her cognitive abilities. She had a normal EKG.   EEG performed August 07, 2014, was an essentially normal record with the patient awake and drowsy, dominant frequency with lower limits of normal. She had a normal background activity with drowsiness and no evidence of interictal activity.  An EEG was performed on September 13, 2015 and was a normal study in the waking state and drowsy.   She was averaging 3 or 4 migraines per month on the calendars sent to [place] since her last visit.  Birth History 8  lbs. 0 oz. infant born at [redacted] weeks gestational age to a 20 year old g 2 p 1 0 0 1 female. Gestation was uncomplicated Mother received Epidural anesthesia  Normal spontaneous vaginal delivery Nursery Course was uncomplicated Growth and Development was recalled as normal  Behavior History none  Surgical History Procedure Laterality Date  . NO PAST SURGERIES     Family History family history includes Cancer in her paternal grandmother; Heart attack in her maternal grandfather. Family history is negative for migraines, seizures,  intellectual disabilities, blindness, deafness, birth defects, chromosomal disorder, or autism.  Social History Social History   Socioeconomic History  . Marital status: Single  . Years of education:  90  . Highest education level:  High school senior  Occupational History  .  Not employed but looking for a job  Social Needs  . Financial resource strain: Not on file  . Food insecurity:    Worry: Not on file    Inability: Not on file  . Transportation needs:    Medical: Not on file    Non-medical: Not on file  Tobacco Use  . Smoking status: Passive Smoke Exposure - Never Smoker  . Smokeless tobacco: Never Used  Substance and Sexual Activity  . Alcohol use: No    Alcohol/week: 0.0 oz  . Drug use: No  . 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    She attends [employer] at home.     She lives with her parents. She has two brothers.    She enjoys listening to music and reading.   No Known Allergies  Physical Exam BP 120/72   Pulse 68   Ht 5\' 7"  (1.702 m)   Wt 216 lb 3.2 oz (98.1 kg)   BMI 33.86 kg/m   General: alert, well developed, obese, in no acute distress, brown hair, hazel eyes, right handed Head: normocephalic, no dysmorphic features Ears, Nose and Throat: Otoscopic: tympanic membranes normal; pharynx: oropharynx is pink without exudates or tonsillar hypertrophy Neck: supple, full range of motion, no cranial or cervical bruits Respiratory: auscultation clear Cardiovascular: no murmurs, pulses are normal Musculoskeletal: no skeletal deformities or apparent scoliosis Skin: no rashes or neurocutaneous lesions  Neurologic Exam  Mental Status: alert; oriented to person, place and year; knowledge is normal for age; language is normal Cranial Nerves: visual fields are full to double simultaneous stimuli; extraocular movements are full and conjugate; pupils are round reactive to light; funduscopic  examination shows sharp disc margins with normal vessels; symmetric facial strength; midline tongue and uvula; air conduction is greater than bone conduction bilaterally Motor: Normal strength, tone and mass; good fine motor movements; no pronator drift Sensory: intact responses to cold, vibration, proprioception and stereognosis Coordination: good finger-to-nose, rapid repetitive alternating movements and finger apposition Gait and Station: normal gait and station: patient is able to walk on heels, toes and tandem without difficulty; balance is adequate; Romberg exam is negative; Gower response is negative Reflexes: symmetric and diminished bilaterally; no clonus; bilateral flexor plantar responses  Assessment 1. Migraine without aura without status migrainosus, not intractable, G43.009. 2. Episodic tension-type headache, not intractable, G44.219. 3. Generalized convulsive epilepsy, G40.309. 4. 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 6. Obesity, E66.09, Z68.54.  Discussion I am pleased that Naveh is doing well.  We need to continue her levetiracetam which is in very low dose.  I told her that I would help in any way that I  could particularly filling out medical forms for driver's license.  We will taper Qudexy to 1 capsule at bedtime and see how she does.  I wrote prescriptions both for Qudexy and levetiracetam.  She did not need refills for her sumatriptan.  Plan She will return to see me in 6 months' time, sooner based on clinical need.  Greater than 50% of the 15-minute visit was spent in counseling and coordination of care regarding her headaches, seizures, and obesity.   Medication List    Accurate as of 01/27/18 11:59 PM.      levETIRAcetam 250 MG tablet Commonly known as:  KEPPRA Take 1 tablet twice daily   midazolam 5 MG/ML injection Commonly known as:  VERSED Place 2 mLs (10 mg total) into the nose once. Draw up 1 mL each in 2 syringes, remove syringe from  the blue vial access device, then attach syringe to nasal atomizer for intranasal administration. Give 1 mL in each nostril for seizures lasting 2 minutes or longer.   PROVENTIL HFA 108 (90 Base) MCG/ACT inhaler Generic drug:  albuterol INHALE 2 PUFFS EVERY 4 HOURS   QUDEXY XR Cs24 sprinkle capsule Generic drug:  topiramate ER Take 1 capsule at bedtime   SUMAtriptan 25 MG tablet Commonly known as:  IMITREX Take one tablet at onset of migraine; May repeat in 2 hours if headache persists or recurs.    The medication list was reviewed and reconciled. All changes or newly prescribed medications were explained.  A complete medication list was provided to the patient/caregiver.  Deetta PerlaWilliam H Mallori Araque MD

## 2018-02-08 ENCOUNTER — Telehealth (INDEPENDENT_AMBULATORY_CARE_PROVIDER_SITE_OTHER): Payer: Self-pay | Admitting: Pediatrics

## 2018-02-08 DIAGNOSIS — G43009 Migraine without aura, not intractable, without status migrainosus: Secondary | ICD-10-CM

## 2018-02-08 MED ORDER — QUDEXY XR 150 MG PO CS24: EXTENDED_RELEASE_CAPSULE | ORAL | 5 refills | Status: DC

## 2018-02-08 NOTE — Telephone Encounter (Signed)
°  Who's calling (name and relationship to patient) : PhotographerAmber (Pharmacist) Best contact number: [phone] Provider they see: Dr. Sharene SkeansHickling  Reason for call: Amber needs different rx written for Qudexy. She stated it was written as XR CS 24 sprinkle capsule but  only comes in XR 25, 50, 100, 150, or 200. Please advise.

## 2018-02-08 NOTE — Telephone Encounter (Signed)
In reviewing the chart, Dr Sharene SkeansHickling planned to taper the Qudexy dose. She was taking 200mg  per day so I sent in a new Rx for 150mg  per day. TG

## 2018-11-18 ENCOUNTER — Other Ambulatory Visit (INDEPENDENT_AMBULATORY_CARE_PROVIDER_SITE_OTHER): Payer: Self-pay | Admitting: Pediatrics

## 2018-11-18 DIAGNOSIS — G40309 Generalized idiopathic epilepsy and epileptic syndromes, not intractable, without status epilepticus: Secondary | ICD-10-CM

## 2018-11-18 DIAGNOSIS — G253 Myoclonus: Secondary | ICD-10-CM

## 2019-01-17 ENCOUNTER — Encounter (INDEPENDENT_AMBULATORY_CARE_PROVIDER_SITE_OTHER): Payer: Self-pay | Admitting: Pediatrics

## 2019-01-17 ENCOUNTER — Other Ambulatory Visit: Payer: Self-pay

## 2019-01-17 ENCOUNTER — Ambulatory Visit (INDEPENDENT_AMBULATORY_CARE_PROVIDER_SITE_OTHER): Payer: Medicaid Other | Admitting: Pediatrics

## 2019-01-17 VITALS — BP 120/90 | HR 72 | Ht 67.0 in | Wt 219.6 lb

## 2019-01-17 DIAGNOSIS — G40309 Generalized idiopathic epilepsy and epileptic syndromes, not intractable, without status epilepticus: Secondary | ICD-10-CM | POA: Diagnosis not present

## 2019-01-17 DIAGNOSIS — G44219 Episodic tension-type headache, not intractable: Secondary | ICD-10-CM | POA: Diagnosis not present

## 2019-01-17 DIAGNOSIS — G253 Myoclonus: Secondary | ICD-10-CM | POA: Diagnosis not present

## 2019-01-17 MED ORDER — LEVETIRACETAM 250 MG PO TABS: ORAL_TABLET | ORAL | 5 refills | Status: DC

## 2019-01-17 NOTE — Patient Instructions (Signed)
I am pleased that you are doing so well.  Congratulations on graduated from high school.  I hope that you will find something to do that interests you.  I hope that you will go back to school and pursue that interest.  Take care of yourself during this time.  I will plan to see you in a year.  I will write a 26-month refill for levetiracetam and then refill it again before I see you next summer.  Please let me know if there is any reason for me to see you sooner such as a breakthrough seizure or some other neurologic problem.  I would strongly recommend that you spend some time exercising so that you can maintain your weight.  You have worked very hard to get to this point.

## 2019-01-17 NOTE — Progress Notes (Signed)
Patient: Tamara [name] MRN: [id number] Sex: female DOB: 1996/01/23  Provider: Ellison CarwinWilliam Hickling, MD [facility]  Note type: Routine return visit  History of Present Illness: Referral Source: Tamara BlaseMeghan Hall, MD History [facility]: [facility] Chief Complaint: Headaches/Epilepsy  Tamara [name] is a 23 y.o. female who returns on January 17, 2019, for the first time since January 27, 2018.  The patient has a history of generalized convulsive epilepsy with myoclonus, migraine without aura, episodic tension-type headaches, and had morbid obesity.  Her weight has stabilized.  I have not seen her in nearly a year.  She has gained only 3 pounds.  She looks well.  While she is still heavy, she has been able to stabilize her weight and has not gained a large amount of weight.  She is driving.  She has not experienced any seizures.  She has 1 to 2 headaches a week that are described as tension-type in nature.  In general, her health is good.  The only medicine that I prescribe is levetiracetam which she takes and tolerates and has controlled her seizures completely.  She has not needed midazolam.  We were able to taper and discontinue Qudexy without significant increase in her headaches and because she has not had any migraines.  She has not had any need for sumatriptan nor she had any need for midazolam because there have been no breakthrough headaches.  Review of Systems: A complete review of systems was remarkable for patient reports that she has not had any seizures since her last visit. She states that she one to two headaches a week. She reports no symptoms. No other concerns at this time, all other systems reviewed and negative.  Past Medical History Diagnosis Date  . Brain mass   . Migraines   . Seizures (HCC)    Hospitalizations: No., Head Injury: No., Nervous System Infections: No., Immunizations up to date: Yes.    Copied from prior chart CT scan of the  brain suggested that she had low-lying cerebellar tonsils. 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 The tonsils were rounded and present no long-term risk to this patient. There is no crowding in the posterior fossa as result of this cerebellar configuration.  August 05, 2014 She felt her left arm going up to her chest and was unable to put it back down and then had no memory for other events. Her brother and mother witnessed generalized tonic-clonic seizure activity with stiffening of her upper extremities or head going back, eyes rolling upwards lasting 2 to 3 minutes with resolution. In the aftermath, she was groggy and postictal. In the emergency department, she had a normal neurological examination and recovered her cognitive abilities. She had a normal EKG.   EEG performed August 07, 2014, was an essentially normal record with the patient awake and drowsy, dominant frequency with lower limits of normal. She had a normal background activity with drowsiness and no evidence of interictal activity.  An EEG was performed on September 13, 2015 and was a normal study in the waking state and drowsy.   She was averaging 3 or 4 migraines per month on the calendars sent to [place] since her last visit.  Birth History 8 lbs. 0 oz. infant born at 3540 weeks gestational age to a 21 year old g 2 p 1 0 0 1 female. Gestation was uncomplicated Mother received Epidural anesthesia  Normal spontaneous vaginal delivery Nursery Course was uncomplicated Growth and Development was recalled as normal  Behavior History none  Surgical History Procedure Laterality Date  . NO PAST SURGERIES     Family History family history includes Cancer in her paternal grandmother; Heart attack in her maternal grandfather. Family history is negative for migraines, seizures, intellectual disabilities, blindness, deafness, birth defects, chromosomal  disorder, or autism.  Social History  Socioeconomic History  . Marital status: Single  . Years of education:  66  . Highest education level:  High school graduate  Occupational History  . Not currently employed  Social Needs  . Financial resource strain: Not on file  . Food insecurity    Worry: Not on file    Inability: Not on file  . Transportation needs    Medical: Not on file    Non-medical: Not on file  Tobacco Use  . Smoking status: Passive Smoke Exposure - Never Smoker  . Smokeless tobacco: Never Used  Substance and Sexual Activity  . Alcohol use: No    Alcohol/week: 0.0 standard drinks  . Drug use: No  . Sexual activity: Never  Lifestyle  . Physical activity    Days per week: Not on file    Minutes per session: Not on file  . Stress: Not on file  Social History Narrative    Tamara [name] is a high school graduate    She attends [employer] at home.     She lives with her parents. She has two brothers.    She enjoys listening to music and reading.   No Known Allergies  Physical Exam BP 120/90   Pulse 72   Ht 5\' 7"  (1.702 m)   Wt 219 lb 9.6 oz (99.6 kg)   BMI 34.39 kg/m   General: alert, well developed, obese, in no acute distress, brown hair, hazel eyes, right handed Head: normocephalic, no dysmorphic features Ears, Nose and Throat: Otoscopic: tympanic membranes normal; pharynx: oropharynx is pink without exudates or tonsillar hypertrophy Neck: supple, full range of motion, no cranial or cervical bruits Respiratory: auscultation clear Cardiovascular: no murmurs, pulses are normal Musculoskeletal: no skeletal deformities or apparent scoliosis Skin: no rashes or neurocutaneous lesions  Neurologic Exam  Mental Status: alert; oriented to person, place and year; knowledge is normal for age; language is normal Cranial Nerves: visual fields are full to double simultaneous stimuli; extraocular movements are full and conjugate; pupils are round reactive to  light; funduscopic examination shows sharp disc margins with normal vessels; symmetric facial strength; midline tongue and uvula; air conduction is greater than bone conduction bilaterally Motor: Normal strength, tone and mass; good fine motor movements; no pronator drift Sensory: intact responses to cold, vibration, proprioception and stereognosis Coordination: good finger-to-nose, rapid repetitive alternating movements and finger apposition Gait and Station: normal gait and station: patient is able to walk on heels, toes and tandem without difficulty; balance is adequate; Romberg exam is negative; Gower response is negative Reflexes: symmetric and diminished bilaterally; no clonus; bilateral flexor plantar responses  Assessment 1. Generalized epilepsy, convulsive, G40.309. 2. Myoclonus, G25.3. 3. Episodic tension-type headache, not intractable, G44.219.  Discussion I am pleased that the patient is doing well.  She has graduated from high school.  I do not think that she is ready to go off to community college yet.  I think that there is a great deal of concern that she has about the Coronavirus and so is planning to stay at home.  I do not  know if she will even seek a job.  Plan I told her that I would see her again in a year and we would refill levetiracetam and any other medicines that she needed in the interim.  I asked her to contact me if she had seizures, recurrence of headaches.  Greater than 50% of a 15-minute visit was spent in counseling and coordination of care.    Medication List   Accurate as of January 17, 2019 11:59 PM. If you have any questions, ask your nurse or doctor.      TAKE these medications   buPROPion 150 MG 24 hr tablet Commonly known as: WELLBUTRIN XL Take 150 mg by mouth daily.   levETIRAcetam 250 MG tablet Commonly known as: KEPPRA TAKE 1 TABLET BY MOUTH TWICE A DAY   midazolam 5 MG/ML injection Commonly known as: VERSED Place 2 mLs (10 mg total) into the  nose once. Draw up 1 mL each in 2 syringes, remove syringe from the blue vial access device, then attach syringe to nasal atomizer for intranasal administration. Give 1 mL in each nostril for seizures lasting 2 minutes or longer.   Proventil HFA 108 (90 Base) MCG/ACT inhaler Generic drug: albuterol INHALE 2 PUFFS EVERY 4 HOURS   SUMAtriptan 25 MG tablet Commonly known as: IMITREX Take one tablet at onset of migraine; May repeat in 2 hours if headache persists or recurs.    The medication list was reviewed and reconciled. All changes or newly prescribed medications were explained.  A complete medication list was provided to the patient/caregiver.  Deetta PerlaWilliam H Hickling MD

## 2019-10-04 ENCOUNTER — Other Ambulatory Visit (INDEPENDENT_AMBULATORY_CARE_PROVIDER_SITE_OTHER): Payer: Self-pay | Admitting: Pediatrics

## 2019-10-04 DIAGNOSIS — G253 Myoclonus: Secondary | ICD-10-CM

## 2019-10-04 DIAGNOSIS — G40309 Generalized idiopathic epilepsy and epileptic syndromes, not intractable, without status epilepticus: Secondary | ICD-10-CM

## 2020-04-01 ENCOUNTER — Encounter (INDEPENDENT_AMBULATORY_CARE_PROVIDER_SITE_OTHER): Payer: Self-pay | Admitting: Pediatrics

## 2020-04-01 ENCOUNTER — Ambulatory Visit (INDEPENDENT_AMBULATORY_CARE_PROVIDER_SITE_OTHER): Payer: Medicaid Other | Admitting: Pediatrics

## 2020-04-01 ENCOUNTER — Other Ambulatory Visit: Payer: Self-pay

## 2020-04-01 VITALS — BP 110/62 | HR 68 | Ht 67.0 in | Wt 227.8 lb

## 2020-04-01 DIAGNOSIS — G253 Myoclonus: Secondary | ICD-10-CM

## 2020-04-01 DIAGNOSIS — E6609 Other obesity due to excess calories: Secondary | ICD-10-CM

## 2020-04-01 DIAGNOSIS — G43009 Migraine without aura, not intractable, without status migrainosus: Secondary | ICD-10-CM

## 2020-04-01 DIAGNOSIS — Z68.41 Body mass index (BMI) pediatric, greater than or equal to 95th percentile for age: Secondary | ICD-10-CM

## 2020-04-01 DIAGNOSIS — G40309 Generalized idiopathic epilepsy and epileptic syndromes, not intractable, without status epilepticus: Secondary | ICD-10-CM

## 2020-04-01 DIAGNOSIS — Q048 Other specified congenital malformations of brain: Secondary | ICD-10-CM

## 2020-04-01 MED ORDER — SUMATRIPTAN SUCCINATE 25 MG PO TABS: ORAL_TABLET | ORAL | 5 refills | Status: DC

## 2020-04-01 MED ORDER — LEVETIRACETAM 250 MG PO TABS: 250.0000 mg | ORAL_TABLET | Freq: Two times a day (BID) | ORAL | 5 refills | Status: DC

## 2020-04-01 MED ORDER — TOPIRAMATE 25 MG PO TABS: ORAL_TABLET | ORAL | 5 refills | Status: DC

## 2020-04-01 NOTE — Patient Instructions (Signed)
It was a pleasure to see you today.  I am sorry that your migraines have recurred and also that you are having neck pain.  Because you have tonsillar ectopia we need to repeat your MRI scan to make certain that it has not worsened.  We will try to set this up over the next few weeks.  I want you to start topiramate and gradually increase it.  I am prescribing Sumatriptan as a rescue medication which you should take with 400 mg of ibuprofen.  You need to continue to get adequate sleep, to hydrate yourself about 48 ounces of fluid per day.  Very pleased that she have a new job and hope that we can get her headaches under control so that it does not interfere.  I want you to strongly consider getting the Covid vaccine.  Please send your calendars to me at the end of each month so that I can review them and decide what we need to do next.  Please return to see me in 3 months time.

## 2020-04-01 NOTE — Progress Notes (Signed)
Patient: Tamara [name] MRN: [id number] Sex: female DOB: Nov 27, 1995  Provider: Ellison Carwin, MD Location of Care: [facility]  Note type: Routine return visit  History of Present Illness: Referral Source: Dulce Sellar, NP History from: patient and [facility] chart Chief Complaint: increased headaches  Tamara [name] is a 24 y.o. female who was evaluated April 01, 2020 for the first time since January 17, 2019.  "Tamara [name]" has a generalized convulsive epilepsy with myoclonus.  She has migraine without aura, episodic tension type headaches and obesity.  Headaches improved as of last visit.  We were able to discontinue Qudexy without significant increase in her headaches.  Unfortunately they have returned.  She experiences them about 3 times a week.  Headaches have been present for the past month.  They are occipitally predominant, left greater than right dull and throbbing, associated with nausea and rare vomiting.  She has sensitivity to light and sound.  She has some associated dizziness.  They last for several hours.  The only thing she is able to do is lie down in bed.  She has neck pain with and without her headaches and her neck feels stiff when she has headaches.  Headaches come on in the afternoon she has no aura.  Fortunately she has not experienced any seizures.  She takes and tolerates levetiracetam without side effects.  Her general health is good.  She has gained about 8 pounds since her last visit which was 15 months ago.  She attributes this to not being as physically active.  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  Since she is driving advanced I hope that this will not be prohibited by her seizures.  She is not had seizures in years and is compliant with her medications.  She gets adequate sleep at nighttime.  She goes to bed between 11 PM and midnight gets up around 9:45 AM.  She sleeps soundly.  She hydrates herself well.   Is not clear to me why the headaches have her.  They have no relationship to her menstrual periods time of day and no obvious triggers.  She has not contracted Covid and has not yet been vaccinated.  I suspect that she will have to become vaccinated as part of her employment.  Review of Systems: A complete review of systems was remarkable for increased headaches, nausea, intermittent vomiting, sensitive to light and sound, all other systems reviewed and negative.  Past Medical History Diagnosis Date  . Brain mass   . Migraines   . Seizures (HCC)    Hospitalizations: Yes.  , Head Injury: No., Nervous System Infections: No., Immunizations up to date: Yes.    Copied from prior chart notes CT scan of the brain suggested that she had low-lying cerebellar tonsils. 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 The tonsils were rounded and present no long-term risk to this patient. There is no crowding in the posterior fossa as result of this cerebellar configuration.  August 05, 2014 She felt her left arm going up to her chest and was unable to put it back down and then had no memory for other events. Her brother and mother witnessed generalized tonic-clonic seizure activity with stiffening of her upper extremities or head going back, eyes rolling upwards lasting 2 to 3 minutes with resolution. In the aftermath, she was groggy and postictal. In the emergency department, she  had a normal neurological examination and recovered her cognitive abilities. She had a normal EKG.   EEG performed August 07, 2014, was an essentially normal record with the patient awake and drowsy, dominant frequency with lower limits of normal. She had a normal background activity with drowsiness and no evidence of interictal activity.  An EEG was performed on September 13, 2015 and was a normal study in the waking state and drowsy.   She was averaging 3  or 4 migraines per month on the calendars sent to [place] since her last visit.  Birth History 8 lbs. 0 oz. infant born at 106 weeks gestational age to a 22 year old g 2 p 1 0 0 1 female. Gestation was uncomplicated Mother received Epidural anesthesia  Normal spontaneous vaginal delivery Nursery Course was uncomplicated Growth and Development was recalled as normal  Behavior History none  Surgical History Procedure Laterality Date  . NO PAST SURGERIES     Family History family history includes Cancer in her paternal grandmother; Heart attack in her maternal grandfather. Family history is negative for migraines, seizures, intellectual disabilities, blindness, deafness, birth defects, chromosomal disorder, or autism.  Social History Socioeconomic History  . Marital status: Single  . Years of education:  2  . Highest education level:  High school graduate  Occupational History  .  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 Smoking status: Passive Smoke Exposure - Never Smoker  . Smokeless tobacco: Never Used  Substance and Sexual Activity  . Alcohol use: No    Alcohol/week: 0.0 standard drinks  . Drug use: No  . Sexual activity: Never  Social History Narrative    Tamara [name] is high school gaduate.    She attended [employer] at home.     She lives with her parents. She has two brothers.    She enjoys listening to music and reading.   No Known Allergies  Physical Exam BP 110/62   Pulse 68   Ht 5\' 7"  (1.702 m)   Wt 227 lb 12.8 oz (103.3 kg)   BMI 35.68 kg/m   General: alert, well developed, obese, in no acute distress, brown with highlights hair, hazel eyes, right handed Head: normocephalic, no dysmorphic features Ears, Nose and Throat: Otoscopic: tympanic membranes normal; pharynx: oropharynx is pink without exudates or tonsillar hypertrophy Neck: supple, full range of motion, no cranial or cervical bruits Respiratory: auscultation  clear Cardiovascular: no murmurs, pulses are normal Musculoskeletal: no skeletal deformities or apparent scoliosis Skin: no rashes or neurocutaneous lesions  Neurologic Exam  Mental Status: alert; oriented to person, place and year; knowledge is normal for age; language is normal Cranial Nerves: visual fields are full to double simultaneous stimuli; extraocular movements are full and conjugate; pupils are round reactive to light; funduscopic examination shows sharp disc margins with normal vessels; symmetric facial strength; midline tongue and uvula; air conduction is greater than bone conduction bilaterally Motor: Normal strength, tone and mass; good fine motor movements; no pronator drift Sensory: intact responses to cold, vibration, proprioception and stereognosis Coordination: good finger-to-nose, rapid repetitive alternating movements and finger apposition Gait and Station: normal gait and station: patient is able to walk on heels, toes and tandem without difficulty; balance is adequate; Romberg exam is negative; Gower response is negative Reflexes: symmetric and diminished bilaterally; no clonus; bilateral flexor plantar responses  Assessment 1.  Migraine without aura without status migrainosus, not intractable, G 43.009. 2.  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  3.  Epilepsy, generalized, convulsive, G40.309. 4.  Myoclonus, G25.3. 5.  Obesity, E 66.9  Discussion I am not certain why her headaches have recurred.  I am concerned about her neck pain.  I doubt that there is been an expansion of her cerebellar tonsils causing brain stem compression but it needs to be evaluated.  Plan She will keep a daily prospective headache calendar.  I will restart topiramate low-dose 25 mg at nighttime and increase it to 50 after a week.  We will obtain an MRI scan of the brain without contrast to assess for tonsillar ectopia.  She is to continue to get adequate sleep to hydrate herself, and to  communicate with me monthly about her headaches and sooner as needed.  Not going to make changes in levetiracetam.  A prescription was issued for that today.  Prescription was also a issued for topiramate and for Sumatriptan.  Greater than 50% of a 30-minute visit was spent in counseling and coordination care to manage her headaches, her seizures, to discuss Covid vaccination, and to explain why further work-up is needed to investigate her neck pain with an MRI scan.   Medication List   Accurate as of April 01, 2020  5:10 PM. If you have any questions, ask your nurse or doctor.      TAKE these medications   levETIRAcetam 250 MG tablet Commonly known as: KEPPRA Take 1 tablet (250 mg total) by mouth 2 (two) times daily.   midazolam 5 MG/ML injection Commonly known as: VERSED Place 2 mLs (10 mg total) into the nose once. Draw up 1 mL each in 2 syringes, remove syringe from the blue vial access device, then attach syringe to nasal atomizer for intranasal administration. Give 1 mL in each nostril for seizures lasting 2 minutes or longer.   SUMAtriptan 25 MG tablet Commonly known as: IMITREX Take one tablet at onset of migraine with 400 mg of ibuprofen; May repeat an additional tablet in 2 hours if headache persists or recurs. What changed: additional instructions Changed by: Ellison Carwin, MD   topiramate 25 MG tablet Commonly known as: TOPAMAX Take one tablet at nighttime for one week then 2 tablets at nighttime Started by: Ellison Carwin, MD    The medication list was reviewed and reconciled. All changes or newly prescribed medications were explained.  A complete medication list was provided to the patient/caregiver.  Deetta Perla MD

## 2020-04-16 ENCOUNTER — Telehealth (INDEPENDENT_AMBULATORY_CARE_PROVIDER_SITE_OTHER): Payer: Self-pay | Admitting: Pediatrics

## 2020-04-16 NOTE — Telephone Encounter (Signed)
  Who's calling (name and relationship to patient) : Everlean Alstrom from [facility]  Best contact number: [phone]  Provider they see: Dr. Sharene Skeans  Reason for call: Requests call back regarding prior auth request for MRI. No case number needed - just patient's name and date of birth.    PRESCRIPTION REFILL ONLY  Name of prescription:  Pharmacy:

## 2020-04-16 NOTE — Telephone Encounter (Signed)
Lvm for Everlean Alstrom to call me back in regards to PA

## 2020-04-16 NOTE — Telephone Encounter (Signed)
Spoke to [place], he called about [facility] not being in network for patient. I called mom to let her know and changed the facility to [facility]. That was approved. Mom is aware to call [facility] and cancel that MRI, I provided her with the MRI number so that she could call and schedule.

## 2020-04-16 NOTE — Telephone Encounter (Signed)
Patient's mother called back and stated patient is now scheduled to have MRI at [facility] on 04/29/20 at 11:00. She also contacted [facility] to cancel the MRI there. Barrington Ellison

## 2020-04-19 ENCOUNTER — Encounter (INDEPENDENT_AMBULATORY_CARE_PROVIDER_SITE_OTHER): Payer: Self-pay

## 2020-04-19 NOTE — Telephone Encounter (Signed)
Headache calendar from September 2021 on Tamara [name]. 17 days were recorded.  8 days were headache free.  5 days were associated with tension type headaches, none required treatment.  There were 4 days of migraines, 1 was severe.  When may need to increase her dose.I will contact the family.

## 2020-04-20 ENCOUNTER — Other Ambulatory Visit: Payer: Self-pay

## 2020-04-29 ENCOUNTER — Ambulatory Visit (HOSPITAL_COMMUNITY)
Admission: RE | Admit: 2020-04-29 | Discharge: 2020-04-29 | Disposition: A | Discharge status: Home / Self Care | Payer: Medicaid Other | Source: Ambulatory Visit | Attending: Pediatrics | Admitting: Pediatrics

## 2020-04-29 ENCOUNTER — Other Ambulatory Visit: Payer: Self-pay

## 2020-04-29 DIAGNOSIS — Q048 Other specified congenital malformations of brain: Secondary | ICD-10-CM | POA: Diagnosis present

## 2020-04-30 ENCOUNTER — Telehealth (INDEPENDENT_AMBULATORY_CARE_PROVIDER_SITE_OTHER): Payer: Self-pay | Admitting: Pediatrics

## 2020-04-30 NOTE — Telephone Encounter (Signed)
MRI scan from October 11 was reviewed.  Tonsillar ectopia is 4 to 5 mm and is unchanged.

## 2020-05-20 ENCOUNTER — Encounter (INDEPENDENT_AMBULATORY_CARE_PROVIDER_SITE_OTHER): Payer: Self-pay

## 2020-05-21 NOTE — Telephone Encounter (Signed)
Headache calendar from October 2021 on [place]. 31 days were recorded.  19 days were headache free.  12 days were associated with tension type headaches, 3 required treatment.  There were no days of migraines.  There is no reason to change current treatment.  I will contact the family.

## 2020-07-03 ENCOUNTER — Ambulatory Visit (INDEPENDENT_AMBULATORY_CARE_PROVIDER_SITE_OTHER): Payer: Medicaid Other | Admitting: Pediatrics

## 2020-11-21 ENCOUNTER — Encounter (INDEPENDENT_AMBULATORY_CARE_PROVIDER_SITE_OTHER): Payer: Self-pay

## 2021-01-24 ENCOUNTER — Telehealth (INDEPENDENT_AMBULATORY_CARE_PROVIDER_SITE_OTHER): Payer: Self-pay | Admitting: Pediatrics

## 2021-01-24 NOTE — Telephone Encounter (Signed)
I left a message with Tamara [name] informing her of my impending retirement and asking if she received a letter from the practice notifying her.  She has not had an appointment since April 01, 2020.  I asked her to call for an appointment.  She has generalized convulsive epilepsy and migraine without aura.  I told her that I want to make a referral to one of our adult neurologists if she has not already done so.

## 2021-02-09 ENCOUNTER — Other Ambulatory Visit (INDEPENDENT_AMBULATORY_CARE_PROVIDER_SITE_OTHER): Payer: Self-pay | Admitting: Pediatrics

## 2021-02-09 DIAGNOSIS — G43009 Migraine without aura, not intractable, without status migrainosus: Secondary | ICD-10-CM

## 2021-02-10 ENCOUNTER — Other Ambulatory Visit (INDEPENDENT_AMBULATORY_CARE_PROVIDER_SITE_OTHER): Payer: Self-pay | Admitting: Pediatrics

## 2021-02-10 DIAGNOSIS — G40309 Generalized idiopathic epilepsy and epileptic syndromes, not intractable, without status epilepticus: Secondary | ICD-10-CM

## 2021-02-10 DIAGNOSIS — G253 Myoclonus: Secondary | ICD-10-CM

## 2021-03-07 ENCOUNTER — Other Ambulatory Visit (INDEPENDENT_AMBULATORY_CARE_PROVIDER_SITE_OTHER): Payer: Self-pay | Admitting: Pediatrics

## 2021-03-07 DIAGNOSIS — G253 Myoclonus: Secondary | ICD-10-CM

## 2021-03-07 DIAGNOSIS — G40309 Generalized idiopathic epilepsy and epileptic syndromes, not intractable, without status epilepticus: Secondary | ICD-10-CM

## 2021-03-11 ENCOUNTER — Other Ambulatory Visit (INDEPENDENT_AMBULATORY_CARE_PROVIDER_SITE_OTHER): Payer: Self-pay | Admitting: Pediatrics

## 2021-03-11 DIAGNOSIS — G43009 Migraine without aura, not intractable, without status migrainosus: Secondary | ICD-10-CM

## 2021-03-21 ENCOUNTER — Other Ambulatory Visit (INDEPENDENT_AMBULATORY_CARE_PROVIDER_SITE_OTHER): Payer: Self-pay | Admitting: Pediatrics

## 2021-03-21 DIAGNOSIS — G253 Myoclonus: Secondary | ICD-10-CM

## 2021-03-21 DIAGNOSIS — G40309 Generalized idiopathic epilepsy and epileptic syndromes, not intractable, without status epilepticus: Secondary | ICD-10-CM

## 2021-03-25 NOTE — Telephone Encounter (Signed)
Left message to call Roniesha Hollingshead at [phone].  Patient is due for a follow up appointment with Dr.Hickling. Patient last seen in office 03/22/20. Refill of Levetiracetam 250 mg take 1 tablet by mouth twice daily #[id number] was sent to pharmacy on 03/07/21 by Dr.Hickling.   Routing to Dr.Hickling for review regarding rx until we can reach patient to schedule a follow up appointment.

## 2021-05-05 ENCOUNTER — Other Ambulatory Visit (INDEPENDENT_AMBULATORY_CARE_PROVIDER_SITE_OTHER): Payer: Self-pay | Admitting: Pediatrics

## 2021-05-05 DIAGNOSIS — G253 Myoclonus: Secondary | ICD-10-CM

## 2021-05-05 DIAGNOSIS — G40309 Generalized idiopathic epilepsy and epileptic syndromes, not intractable, without status epilepticus: Secondary | ICD-10-CM

## 2021-05-05 NOTE — Telephone Encounter (Signed)
I sent in 1 refill and a message to the scheduler to contact the patient for an appointment. TG

## 2021-05-22 ENCOUNTER — Ambulatory Visit (INDEPENDENT_AMBULATORY_CARE_PROVIDER_SITE_OTHER): Payer: Medicaid Other | Admitting: Family

## 2021-06-16 ENCOUNTER — Other Ambulatory Visit (INDEPENDENT_AMBULATORY_CARE_PROVIDER_SITE_OTHER): Payer: Self-pay | Admitting: Family

## 2021-06-16 ENCOUNTER — Encounter (INDEPENDENT_AMBULATORY_CARE_PROVIDER_SITE_OTHER): Payer: Self-pay

## 2021-06-16 DIAGNOSIS — G40309 Generalized idiopathic epilepsy and epileptic syndromes, not intractable, without status epilepticus: Secondary | ICD-10-CM

## 2021-06-16 DIAGNOSIS — G253 Myoclonus: Secondary | ICD-10-CM

## 2021-07-09 ENCOUNTER — Encounter (INDEPENDENT_AMBULATORY_CARE_PROVIDER_SITE_OTHER): Payer: Self-pay | Admitting: Family

## 2021-07-17 ENCOUNTER — Other Ambulatory Visit (INDEPENDENT_AMBULATORY_CARE_PROVIDER_SITE_OTHER): Payer: Self-pay | Admitting: Family

## 2021-07-17 DIAGNOSIS — G40309 Generalized idiopathic epilepsy and epileptic syndromes, not intractable, without status epilepticus: Secondary | ICD-10-CM

## 2021-07-17 DIAGNOSIS — G253 Myoclonus: Secondary | ICD-10-CM

## 2021-07-28 NOTE — Telephone Encounter (Signed)
I approved medications for 1 month.  She needs to follow-up with Inetta Fermo or establish with an adult neurologist for further refills.  She can not see Lurena Joiner due to her age, Lurena Joiner can see under 21 only.   Lorenz Coaster MD MPH

## 2021-09-01 ENCOUNTER — Other Ambulatory Visit (INDEPENDENT_AMBULATORY_CARE_PROVIDER_SITE_OTHER): Payer: Self-pay | Admitting: Pediatrics

## 2021-09-01 DIAGNOSIS — G253 Myoclonus: Secondary | ICD-10-CM

## 2021-09-01 DIAGNOSIS — G40309 Generalized idiopathic epilepsy and epileptic syndromes, not intractable, without status epilepticus: Secondary | ICD-10-CM

## 2021-11-06 ENCOUNTER — Ambulatory Visit (INDEPENDENT_AMBULATORY_CARE_PROVIDER_SITE_OTHER): Payer: Medicaid Other | Admitting: Family

## 2021-11-06 ENCOUNTER — Encounter (INDEPENDENT_AMBULATORY_CARE_PROVIDER_SITE_OTHER): Payer: Self-pay | Admitting: Family

## 2021-11-06 VITALS — BP 110/70 | HR 86 | Ht 66.93 in | Wt 263.6 lb

## 2021-11-06 DIAGNOSIS — G44219 Episodic tension-type headache, not intractable: Secondary | ICD-10-CM

## 2021-11-06 DIAGNOSIS — Z68.41 Body mass index (BMI) pediatric, greater than or equal to 95th percentile for age: Secondary | ICD-10-CM

## 2021-11-06 DIAGNOSIS — G40309 Generalized idiopathic epilepsy and epileptic syndromes, not intractable, without status epilepticus: Secondary | ICD-10-CM | POA: Diagnosis not present

## 2021-11-06 DIAGNOSIS — Q048 Other specified congenital malformations of brain: Secondary | ICD-10-CM

## 2021-11-06 DIAGNOSIS — E6609 Other obesity due to excess calories: Secondary | ICD-10-CM

## 2021-11-06 DIAGNOSIS — G43009 Migraine without aura, not intractable, without status migrainosus: Secondary | ICD-10-CM

## 2021-11-06 DIAGNOSIS — G253 Myoclonus: Secondary | ICD-10-CM

## 2021-11-06 MED ORDER — SUMATRIPTAN SUCCINATE 25 MG PO TABS: ORAL_TABLET | ORAL | 5 refills | Status: DC

## 2021-11-06 MED ORDER — TOPIRAMATE 25 MG PO TABS: ORAL_TABLET | ORAL | 5 refills | Status: DC

## 2021-11-06 MED ORDER — LEVETIRACETAM 250 MG PO TABS: 250.0000 mg | ORAL_TABLET | Freq: Two times a day (BID) | ORAL | 5 refills | Status: DC

## 2021-11-06 MED ORDER — NAYZILAM 5 MG/0.1ML NA SOLN: NASAL | 0 refills | Status: AC

## 2021-11-06 NOTE — Patient Instructions (Addendum)
It was a pleasure to see you today! ? ?Instructions for you until your next appointment are as follows: ?I have sent in refills for your medications. ?Let me know if you have any seizures or if your headaches become more frequent or more severe ?f you decide to plan a pregnancy, be sure to talk with me or Dr Karel Jarvis once you transfer care to her, since the Topiramate should not be taken by pregnant women.  ?Also remember that Topiramate can make oral contraceptives less effective in preventing pregnancy. Use a barrier method (such as a condom) to supplement the birth control method. ?For your headaches, remember that it is important for you to avoid skipping meals, to drink plenty of water each day and to get at least 8 hours of sleep each night as these things are known to reduce how often headaches occur.   ?I changed the Midazolam to [facility] - it is the same medication but is a pre-packaged nose spray to use if you have a seizure that lasts more than 2 minutes.  ?I sent in a referral to Dr Patrcia Dolly with [facility]. I will manage any refills or problems until you get established with Dr Karel Jarvis ?Please sign up for MyChart if you have not done so. ? ?Feel free to contact our office during normal business hours at [phone] with questions or concerns. If there is no answer or the call is outside business hours, please leave a message and our clinic staff will call you back within the next business day.  If you have an urgent concern, please stay on the line for our after-hours answering service and ask for the on-call neurologist.   ?  ?I also encourage you to use MyChart to communicate with me more directly. If you have not yet signed up for MyChart within [facility], the front desk staff can help you. However, please note that this inbox is NOT monitored on nights or weekends, and response can take up to 2 business days.  Urgent matters should be discussed with the on-call pediatric neurologist.  ? ?At  [facility], we are committed to providing exceptional care. You will receive a patient satisfaction survey through text or email regarding your visit today. Your opinion is important to me. Comments are appreciated.   ?

## 2021-11-06 NOTE — Progress Notes (Signed)
? ?Tamara [name]   ?MRN:  [id number]  ?December 29, 1995  ? ?Provider: Elveria Rising NP-C ?Location of Care: [facility] ? ?Visit type: Return visit ? ?Last visit: 04/01/2020 with Dr Sharene Skeans ? ?Referral source: Tamara Sellar, NP ?History from: Epic chart and patient ? ?Brief history:  ?Copied from previous record: ?"Tamara [name]" has a generalized convulsive epilepsy with myoclonus.  She has migraine without aura, episodic tension type headaches and obesity. ? ?Today's concerns: ?Tamara [name] reports today that she has remained seizure free since her last visit. She says that she is compliant with medication and generally gets enough sleep.  ? ?Tamara [name] reports intermittent headaches. She says that migraines occur once or twice per month and that Sumatriptan works well to abort them when they occur. She denies skipping meals and says that she drinks fluids throughout the day.  ? ?Tamara [name] is looking for a new job and admits that is somewhat stressful. She has been working in [employer]rap[employer], occupational and is considering returning to school to learn a skill.  ? ?Tamara [name] has been otherwise generally healthy since she was last seen. She has no other health concerns today other than previously mentioned. ? ?Review of systems: ?Please see HPI for neurologic and other pertinent review of systems. Otherwise all other systems were reviewed and were negative. ? ?Problem List: ?Patient Active Problem List  ? Diagnosis Date Noted  ? Epilepsy, generalized, convulsive (HCC) 03/20/2015  ? 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 Migraine without aura and without status migrainosus, not intractable 08/29/2014  ? Episodic tension-type headache 08/29/2014  ? Single epileptic seizure (HCC) 08/29/2014  ? Myoclonus 08/29/2014  ? Arnold-Chiari malformation, type I (HCC) 07/24/2014  ? Generalized headaches 07/24/2014  ? Vertigo of central origin 07/24/2014  ? Obesity 07/24/2014  ?  ? ?Past Medical History:  ?Diagnosis Date  ? Brain mass   ?  Migraines   ? Seizures (HCC)   ?  ?Past medical history comments: See HPI ?Copied from previous record: ?CT scan of the brain suggested that she had low-lying cerebellar tonsils.  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  The tonsils were rounded and present no long-term risk to this patient.  There is no crowding in the posterior fossa as result of this cerebellar configuration. ?  ?August 05, 2014 She felt her left arm going up to her chest and was unable to put it back down and then had no memory for other events.  Her brother and mother witnessed generalized tonic-clonic seizure activity with stiffening of her upper extremities or head going back, eyes rolling upwards lasting 2 to 3 minutes with resolution.  In the aftermath, she was groggy and postictal.  In the emergency department, she had a normal neurological examination and recovered her cognitive abilities.  She had a normal EKG.   ?  ?EEG performed August 07, 2014, was an essentially normal record with the patient awake and drowsy, dominant frequency with lower limits of normal.  She had a normal background activity with drowsiness and no evidence of interictal activity. ?  ?An EEG was performed on September 13, 2015 and was a normal study in the waking state and drowsy.  ?  ?She was averaging 3 or 4 migraines per month on the calendars sent to [place] since her last visit. ?  ?Birth History ?8 lbs. 0 oz. infant born at [redacted] weeks gestational age to a 24 year old g 2  p 1 0 0 1 female. ?Gestation was uncomplicated ?Mother received Epidural anesthesia   ?Normal spontaneous vaginal delivery ?Nursery Course was uncomplicated ?Growth and Development was recalled as  normal ? ?Surgical history: ?Past Surgical History:  ?Procedure Laterality Date  ? NO PAST SURGERIES    ?  ? ?Family history: ?family history includes Cancer in her paternal grandmother; Heart attack in her maternal grandfather.  ? ?Social  history: ?Social History  ? ?Socioeconomic History  ? Marital status: Single  ?  Spouse name: Not on file  ? Number of children: Not on file  ? Years of education: Not on file  ? Highest education level: Not on file  ?Occupational History  ? Not on file  ?Tobacco Use  ? Smoking status: Never  ?  Passive exposure: Yes  ? Smokeless tobacco: Never  ?Substance and Sexual Activity  ? Alcohol use: No  ?  Alcohol/week: 0.0 standard drinks  ? Drug use: No  ? Sexual activity: Never  ?Other Topics Concern  ? Not on file  ?Social History Narrative  ? Tamara [name] is high school gaduate.  ? She attended [employer] at home.   ? She lives with her parents. She has two brothers.  ? She enjoys listening to music and reading.  ? ?Social Determinants of Health  ? ?Financial Resource Strain: Not on file  ?Food Insecurity: Not on file  ?Transportation Needs: Not on file  ?Physical Activity: Not on file  ?Stress: Not on file  ?Social Connections: Not on file  ?Intimate Partner Violence: Not on file  ?  ?Past/failed meds: ?Copied from previous record: ?Qudexy for migraine prophylaxis - cost ? ?Allergies: ?No Known Allergies  ? ?Immunizations: ? ?There is no immunization history on file for this patient.  ? ? ?Diagnostics/Screenings: ?Copied from previous record: ?CT scan of the brain suggested that she had low-lying cerebellar tonsils.  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  The tonsils were rounded and present no long-term risk to this patient.  There is no crowding in the posterior fossa as result of this cerebellar configuration. ?  ?EEG performed August 07, 2014, was an essentially normal record with the patient awake and drowsy, dominant frequency with lower limits of normal.  She had a normal background activity with drowsiness and no evidence of interictal activity. ?  ?An EEG was performed on September 13, 2015 and was a normal study in the waking state and  drowsy.  ? ?Physical Exam: ?BP 110/70   Pulse 86   Ht 5' 6.93" (1.7 m)   Wt 263 lb 9.6 oz (119.6 kg)   BMI 41.37 kg/m?   ?General: Well developed, well nourished obese woman, seated in exam room, in no evident distress ?Head: Head normocephalic and atraumatic.  Oropharynx benign. ?Neck: Supple ?Cardiovascular: Regular rate and rhythm, no murmurs ?Respiratory: Breath sounds clear to auscultation ?Musculoskeletal: No obvious deformities or scoliosis ?Skin: No rashes or neurocutaneous lesions ? ?Neurologic Exam ?Mental Status: Awake and fully alert.  Oriented to place and time.  Recent and remote memory intact.  Attention span, concentration, and fund of knowledge appropriate.  Mood and affect appropriate. ?Cranial Nerves: Fundoscopic exam reveals sharp disc margins.  Pupils equal, briskly reactive to light.  Extraocular movements full without nystagmus. Hearing intact and symmetric to whisper.  Facial sensation intact.  Face tongue, palate move normally and symmetrically. Shoulder shrug normal ?Motor: Normal bulk and tone. Normal strength in  all tested extremity muscles. ?Sensory: Intact to touch and temperature in all extremities.  ?Coordination: Rapid alternating movements normal in all extremities.  Finger-to-nose and heel-to shin performed accurately bilaterally.  Romberg negative. ?Gait and Station: Arises from chair without difficulty.  Stance is normal. Gait demonstrates normal stride length and balance.   Able to heel, toe and tandem walk without difficulty. ?Reflexes: 1+ and symmetric. Toes downgoing.  ? ?Impression: ?Epilepsy, generalized, convulsive (HCC) - Plan: levETIRAcetam (KEPPRA) 250 MG tablet, Ambulatory referral to Neurology ? ?Myoclonus - Plan: levETIRAcetam (KEPPRA) 250 MG tablet, Ambulatory referral to Neurology ? ?Migraine without aura and without status migrainosus, not intractable - Plan: topiramate (TOPAMAX) 25 MG tablet, SUMAtriptan (IMITREX) 25 MG tablet, Ambulatory referral to  Neurology ? ?Cerebellar tonsillar ectopia (HCC) ? ?Episodic tension-type headache, not intractable ? ?Obesity due to excess calories without serious comorbidity with body mass index (BMI) in 95th to 98th percentile for a

## 2021-11-08 ENCOUNTER — Encounter (INDEPENDENT_AMBULATORY_CARE_PROVIDER_SITE_OTHER): Payer: Self-pay | Admitting: Family

## 2022-04-13 ENCOUNTER — Encounter: Payer: Self-pay | Admitting: *Deleted

## 2022-05-11 ENCOUNTER — Other Ambulatory Visit (INDEPENDENT_AMBULATORY_CARE_PROVIDER_SITE_OTHER): Payer: Self-pay | Admitting: Family

## 2022-05-11 DIAGNOSIS — G253 Myoclonus: Secondary | ICD-10-CM

## 2022-05-11 DIAGNOSIS — G40309 Generalized idiopathic epilepsy and epileptic syndromes, not intractable, without status epilepticus: Secondary | ICD-10-CM

## 2022-07-02 ENCOUNTER — Encounter: Payer: Self-pay | Admitting: *Deleted

## 2023-02-17 ENCOUNTER — Telehealth (INDEPENDENT_AMBULATORY_CARE_PROVIDER_SITE_OTHER): Payer: Self-pay | Admitting: Family

## 2023-02-17 NOTE — Telephone Encounter (Signed)
Attempted to contact patient. Patient unable to be reached. LVM to call back.  SS, CCMA

## 2023-02-17 NOTE — Telephone Encounter (Signed)
Contacted patient. Verified patients name and DOB.  Patient stated that she has been experiencing  - Sensitivity to light - Headaches - Exhaustion  - Neck pain - Leg   Patient stated that the Keppra is the medication that isn't working. She has been taking that medication as prescribed.  SS, CCMA

## 2023-02-17 NOTE — Telephone Encounter (Signed)
Unfortunately it has been more than a year since I saw her. At that time, I referred her to adult neurology but the chart says that they were unable to get in touch with her. Since it has been so long since I saw her, I am unable to give her a note for work today. She can check with her PCP and/or make an appointment with me for next available visit. I will plan to re-refer her to adult neurology at that time or her PCP can do refer her. Thanks, Inetta Fermo

## 2023-02-17 NOTE — Telephone Encounter (Signed)
  Name of who is calling: [name]; mom   Caller's Relationship to Patient: Self  Best contact number: [phone]  Provider they see: Elveria Rising, NP  Reason for call: "Tamara [name] called in stating that she is having seizure like symptoms; and was not able to go to work today. She stated the medication is not working. Tamara [name] was also wanting a work note for today. She is requesting a call back.      PRESCRIPTION REFILL ONLY  Name of prescription:  Pharmacy:

## 2023-03-16 ENCOUNTER — Ambulatory Visit (INDEPENDENT_AMBULATORY_CARE_PROVIDER_SITE_OTHER): Payer: Medicaid Other | Admitting: Family

## 2023-03-16 ENCOUNTER — Encounter (INDEPENDENT_AMBULATORY_CARE_PROVIDER_SITE_OTHER): Payer: Self-pay | Admitting: Family

## 2023-03-16 VITALS — BP 100/68 | HR 86 | Ht 67.13 in | Wt 274.6 lb

## 2023-03-16 DIAGNOSIS — G253 Myoclonus: Secondary | ICD-10-CM

## 2023-03-16 DIAGNOSIS — G44219 Episodic tension-type headache, not intractable: Secondary | ICD-10-CM

## 2023-03-16 DIAGNOSIS — G935 Compression of brain: Secondary | ICD-10-CM | POA: Diagnosis not present

## 2023-03-16 DIAGNOSIS — G43009 Migraine without aura, not intractable, without status migrainosus: Secondary | ICD-10-CM

## 2023-03-16 DIAGNOSIS — G40309 Generalized idiopathic epilepsy and epileptic syndromes, not intractable, without status epilepticus: Secondary | ICD-10-CM | POA: Diagnosis not present

## 2023-03-16 MED ORDER — IBUPROFEN 400 MG PO TABS
ORAL_TABLET | ORAL | 0 refills | Status: AC
Start: 2023-03-16 — End: ?

## 2023-03-16 MED ORDER — TOPIRAMATE 25 MG PO TABS
ORAL_TABLET | ORAL | 1 refills | Status: DC
Start: 2023-03-16 — End: 2023-04-09

## 2023-03-16 MED ORDER — SUMATRIPTAN SUCCINATE 50 MG PO TABS
ORAL_TABLET | ORAL | 0 refills | Status: DC
Start: 2023-03-16 — End: 2023-07-16

## 2023-03-16 MED ORDER — LEVETIRACETAM 500 MG PO TABS
500.0000 mg | ORAL_TABLET | Freq: Two times a day (BID) | ORAL | 0 refills | Status: DC
Start: 2023-03-16 — End: 2023-09-29

## 2023-03-16 NOTE — Progress Notes (Signed)
Tamara [name]   MRN:  [id number]  11-15-1995   Provider: Elveria Rising NP-C Location of Care: [facility]  Visit type: Return visit  Last visit: 10/19/2021  Referral source: Tamara Sellar, NP History from: Epic chart and patient  Brief history:  Copied from previous record: "Tamara [name]" has a generalized convulsive epilepsy with myoclonus.  She also has history of [place] [address] Chiari malformation type 1, migraine without aura, episodic tension type headaches and obesity.   Today's concerns: She reports today that she has been having more migraine headaches and myoclonic jerks over the least month. 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 She says that she has been compliant with taking Levetiracetam. She says that she is getting about 6-7 hours of sleep each night.  Tamara [name] also reports that she has had more migraine headaches over the last month. She says that she ran out of Topiramate and hasn't been taking it recently. She also admits to skipping meals, often eating only once per day, and not drinking much water.  When migraines occur, she has holocephalic pain, intolerance to light and feels poorly. She has found that Sumatriptan 25mg  dulls the pain but does not abort it.  She was referred to adult neurology provider last year but that did not occur for reasons unclear to me.  Tamara [name] has been otherwise generally healthy since she was last seen. No health concerns today other than previously mentioned.  Review of systems: Please see HPI for neurologic and other pertinent review of systems. Otherwise all other systems were reviewed and were negative.  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  The tonsils were rounded and present no long-term risk to this patient.  There is no crowding in the posterior fossa as result of this cerebellar configuration.   August 05, 2014 She felt her left arm going up to her chest and was unable to put it back down and then had no memory for other events.  Her brother and mother witnessed generalized tonic-clonic seizure activity with stiffening of her upper extremities or head going back, eyes rolling upwards lasting 2 to 3 minutes with resolution.  In the aftermath, she was groggy and postictal.  In the emergency department, she had a normal neurological examination and recovered her cognitive abilities.  She had a normal EKG.     EEG performed August 07, 2014, was an essentially normal record with the patient awake and drowsy, dominant frequency with lower limits of  normal.  She had a normal background activity with drowsiness and no evidence of interictal activity.   An EEG was performed on September 13, 2015 and was a normal study in the waking state and drowsy.    She was averaging 3 or 4 migraines per month on the calendars sent to [place] since her last visit.   Birth History 8 lbs. 0 oz. infant born at [redacted] weeks gestational age  to a 25 year old g 2 p 1 0 0 1 female. Gestation was uncomplicated Mother received Epidural anesthesia   Normal spontaneous vaginal delivery Nursery Course was uncomplicated Growth and Development was recalled as normal  Surgical history: Past Surgical History:  Procedure Laterality Date   NO PAST SURGERIES       Family history: family history includes Cancer in her paternal grandmother; Heart attack in her maternal grandfather.   Social history: Social History   Socioeconomic History   Marital status: Single    Spouse name: Not on file   Number of children: Not on file   Years of education: Not on file   Highest education level: Not on file  Occupational History   Not on file  Tobacco Use   Smoking status: Never    Passive exposure: Yes   Smokeless tobacco: Never  Substance and Sexual Activity   Alcohol use: No    Alcohol/week: 0.0 standard drinks of alcohol   Drug use: No   Sexual activity: Never  Other Topics Concern   Not on file  Social History Narrative   [name] is high school gaduate.   She attended [employer] at home.    She lives with her parents. She has two brothers.   She enjoys listening to music and reading.   Social Determinants of Health   Financial Resource Strain: Not on file  Food Insecurity: Not on file  Transportation Needs: Not on file  Physical Activity: Not on file  Stress: Not on file  Social Connections: Not on file  Intimate Partner Violence: Not on file    Past/failed meds: Copied from previous record: Qudexy for migraine prophylaxis - cost   Allergies: No Known Allergies    Immunizations:  There is no immunization history on file for this patient.   Diagnostics/Screenings: Copied from previous record: CT scan of the brain suggested that she had low-lying cerebellar tonsils.  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  The tonsils were rounded and  present no long-term risk to this patient.  There is no crowding in the posterior fossa as result of this cerebellar configuration.   EEG performed August 07, 2014, was an essentially normal record with the patient awake and drowsy, dominant frequency with lower limits of normal.  She had a normal background activity with drowsiness and no evidence of interictal activity.   An EEG was performed on September 13, 2015 and was a normal study in the waking state and drowsy.    Physical Exam: BP 100/68 (BP Location: Left Arm, Patient Position: Sitting, Cuff Size: Large)   Pulse 86   Ht 5' 7.13" (1.705 m)   Wt 274 lb 9.6 oz (124.6 kg)   LMP 03/07/2023 (Exact Date)   BMI 42.85 kg/m   General: Well developed, well nourished obese young woman, seated on exam table, in no evident distress Head: Head normocephalic and atraumatic.  Oropharynx benign. Neck: Supple Cardiovascular: Regular rate and rhythm, no murmurs Respiratory: Breath  sounds clear to auscultation Musculoskeletal: No obvious deformities or scoliosis Skin: No rashes or neurocutaneous lesions  Neurologic Exam Mental Status: Awake and fully alert.  Oriented to place and time.  Recent and remote memory intact.  Attention span, concentration, and fund of knowledge appropriate.  Mood and affect appropriate. Cranial Nerves: Fundoscopic exam reveals sharp disc margins.  Pupils equal, briskly reactive to light.  Extraocular movements full without nystagmus. Hearing intact and symmetric to whisper.  Facial sensation intact.  Face tongue, palate move normally and symmetrically. Shoulder shrug normal Motor: Normal bulk and tone. Normal strength in all tested extremity muscles. Sensory: Intact to touch and temperature in all extremities.  Coordination: Rapid alternating movements normal in all extremities.  Finger-to-nose and heel-to shin performed accurately bilaterally.  Romberg negative. Gait and Station: Arises from chair without difficulty.   Stance is normal. Gait demonstrates normal stride length and balance.   Able to heel, toe and tandem walk without difficulty. Reflexes: 1+ and symmetric. Toes downgoing.   Impression: Epilepsy, generalized, convulsive (HCC) - Plan: Ambulatory referral to Neurology, levETIRAcetam (KEPPRA) 500 MG tablet  Migraine without aura and without status migrainosus, not intractable - Plan: Ambulatory referral to Neurology, SUMAtriptan (IMITREX) 50 MG tablet, topiramate (TOPAMAX) 25 MG tablet, ibuprofen (ADVIL) 400 MG tablet  Arnold-Chiari malformation, type I (HCC) - Plan: Ambulatory referral to Neurology  Episodic tension-type headache, not intractable - Plan: Ambulatory referral to Neurology  Myoclonus - Plan: Ambulatory referral to Neurology, levETIRAcetam (KEPPRA) 500 MG tablet   Recommendations for plan of care: The patient's previous Epic records were reviewed. No recent diagnostic studies to be reviewed with the patient.  Plan until next visit: Increase Levetiracetam to 500mg  BID Restart Topiramate for migraine prophylaxis Increase Sumatriptan to 50mg  + Ibuprofen 400mg  Instructed to work on not skipping meals, drinking more water and getting more sleep.  Call for questions or concerns Referred to adult neurology provider  The medication list was reviewed and reconciled. No changes were made in the prescribed medications today. A complete medication list was provided to the patient.  Orders Placed This Encounter  Procedures   Ambulatory referral to Neurology    Referral Priority:   Routine    Referral Type:   Consultation    Referral Reason:   Specialty Services Required    Requested Specialty:   Neurology    Number of Visits Requested:   1     Allergies as of 03/16/2023   No Known Allergies      Medication List        Accurate as of March 16, 2023 12:08 PM. If you have any questions, ask your nurse or doctor.          STOP taking these medications    buPROPion 150 MG  24 hr tablet Commonly known as: WELLBUTRIN XL Stopped by: Elveria Rising   midazolam 10 MG/2ML Soln injection Commonly known as: VERSED Stopped by: Elveria Rising       TAKE these medications    escitalopram 10 MG tablet Commonly known as: LEXAPRO Take 10 mg by mouth daily.   ibuprofen 400 MG tablet Commonly known as: ADVIL Take 1 tablet at onset of headache. May repeat in 6 hours if headache persists Started by: Elveria Rising   levETIRAcetam 500 MG tablet Commonly known as: Keppra Take 1 tablet (500 mg total) by mouth 2 (two) times daily. What changed:  medication strength how much to take Changed by: Mylo Red 5 MG/0.1ML Soln Generic  drug: Midazolam Place 1 spray in to 1 nostril for seizures lasting 2 minutes or longer   SUMAtriptan 50 MG tablet Commonly known as: IMITREX Take 1 tablet at onset of migraine along with Ibuprofen 400mg . May repeat in 2 hours if headache persists or recurs. What changed:  medication strength additional instructions Changed by: Elveria Rising   topiramate 25 MG tablet Commonly known as: TOPAMAX Take 2 tablets at nighttime      Total time spent with the patient was 25 minutes, of which 50% or more was spent in counseling and coordination of care.  Elveria Rising NP-C [facility] [address] [place], [place] [address] Ph. [phone] Fax [phone]

## 2023-03-16 NOTE — Patient Instructions (Addendum)
It was a pleasure to see you today!  Instructions for you until your next appointment are as follows: For the muscle jerks - I have changed the dose of your seizure medicine to Levetiracetam 500mg  tablets. Take 1 tablet in the morning and take 1 tablet at night. If you have any of the Levetiracetam 250mg  tablets left over, you can use them up by taking 2 tablets in the morning and 2 tablets at night. Start this tonight.  It is very important that you stay on a sleep schedule as seizures and muscle jerks can be triggered by inadequate sleep.   Take the medication Topiramate to prevent headaches as prescribed. If you have not been taking it recently, restart by taking 1 tablet at night for 3 nights, then take 2 tablets at night.  Remember that it is very important for you to drink at least 60 oz of water while taking this medication.  Also remember that this medication should not be taken by pregnant women. While taking Topiramate, be sure to use adequate birth control methods.   When you have a migraine headache, take Sumatriptan 50mg  + Ibuprofen 400mg  at the onset of pain. Remember that it is best the take the medication as soon as you realize that you are developing a migraine in order for the medicine to stop the migraine.  If the migraine is still present in 2 hours, you can take 1 more Sumatriptan. Do not take more than 2 tablets in 1 day.  If you still have a headache in 6 hours, you can take another Ibuprofen 400mg  tablet. Try to rest in a quiet place for 20-30 minutes when the migraine symptoms start.  Be sure to drink some fluids when you feel a migraine starting. Try to drink at least 12-20 oz, more if you can tolerate it. Good choices at this time would be a sports drink or a soft drink. Milk or protein drinks may not be the best thing for your stomach as a migraine starts, and will not help with fluid replenishment that you need at this time.   Remember that it is important for you to  avoid skipping meals, to drink at least 60oz water each day, to get at least 8 hours of sleep each night and to manage stress, as these things are known to reduce how often headaches occur.    A referral has been placed to [facility] for an adult neurology provider. Call them at [phone] to schedule your next appointment there.  I will manage refills and any concerns until you are established at that office for adult neurology care.   Please sign up for MyChart if you have not done so.  Feel free to contact our office during normal business hours at [phone] with questions or concerns. If there is no answer or the call is outside business hours, please leave a message and our clinic staff will call you back within the next business day.  If you have an urgent concern, please stay on the line for our after-hours answering service and ask for the on-call neurologist.     I also encourage you to use MyChart to communicate with me more directly. If you have not yet signed up for MyChart within [facility], the front desk staff can help you. However, please note that this inbox is NOT monitored on nights or weekends, and response can take up to 2 business days.  Urgent matters should be discussed with the on-call  pediatric neurologist.   At [facility], we are committed to providing exceptional care. You will receive a patient satisfaction survey through text or email regarding your visit today. Your opinion is important to me. Comments are appreciated.

## 2023-04-09 ENCOUNTER — Other Ambulatory Visit (INDEPENDENT_AMBULATORY_CARE_PROVIDER_SITE_OTHER): Payer: Self-pay | Admitting: Family

## 2023-04-09 DIAGNOSIS — G43009 Migraine without aura, not intractable, without status migrainosus: Secondary | ICD-10-CM

## 2023-04-14 ENCOUNTER — Other Ambulatory Visit (INDEPENDENT_AMBULATORY_CARE_PROVIDER_SITE_OTHER): Payer: Self-pay | Admitting: Family

## 2023-04-14 DIAGNOSIS — G43009 Migraine without aura, not intractable, without status migrainosus: Secondary | ICD-10-CM

## 2023-04-14 NOTE — Telephone Encounter (Signed)
She was referred to [facility] for transfer of care to adult neurology. The referral note says that they were unable to contact her. She needs to call [facility] to schedule an appointment at [phone]. TG

## 2023-06-13 ENCOUNTER — Other Ambulatory Visit (INDEPENDENT_AMBULATORY_CARE_PROVIDER_SITE_OTHER): Payer: Self-pay | Admitting: Family

## 2023-06-13 DIAGNOSIS — G40309 Generalized idiopathic epilepsy and epileptic syndromes, not intractable, without status epilepticus: Secondary | ICD-10-CM

## 2023-06-13 DIAGNOSIS — G253 Myoclonus: Secondary | ICD-10-CM

## 2023-06-21 ENCOUNTER — Encounter (INDEPENDENT_AMBULATORY_CARE_PROVIDER_SITE_OTHER): Payer: Self-pay | Admitting: Family

## 2023-07-16 ENCOUNTER — Other Ambulatory Visit (INDEPENDENT_AMBULATORY_CARE_PROVIDER_SITE_OTHER): Payer: Self-pay | Admitting: Family

## 2023-07-16 DIAGNOSIS — G43009 Migraine without aura, not intractable, without status migrainosus: Secondary | ICD-10-CM

## 2023-09-15 ENCOUNTER — Other Ambulatory Visit (INDEPENDENT_AMBULATORY_CARE_PROVIDER_SITE_OTHER): Payer: Self-pay | Admitting: Family

## 2023-09-15 DIAGNOSIS — G40309 Generalized idiopathic epilepsy and epileptic syndromes, not intractable, without status epilepticus: Secondary | ICD-10-CM

## 2023-09-15 DIAGNOSIS — G253 Myoclonus: Secondary | ICD-10-CM

## 2023-09-29 ENCOUNTER — Other Ambulatory Visit (INDEPENDENT_AMBULATORY_CARE_PROVIDER_SITE_OTHER): Payer: Self-pay | Admitting: Family

## 2023-09-29 DIAGNOSIS — G253 Myoclonus: Secondary | ICD-10-CM

## 2023-09-29 DIAGNOSIS — G40309 Generalized idiopathic epilepsy and epileptic syndromes, not intractable, without status epilepticus: Secondary | ICD-10-CM

## 2023-09-29 MED ORDER — LEVETIRACETAM 500 MG PO TABS: 500.0000 mg | ORAL_TABLET | Freq: Two times a day (BID) | ORAL | 0 refills | Status: DC

## 2023-09-29 NOTE — Telephone Encounter (Signed)
 Patient called to get refill on keppra medication, [facility] in [place] [address]. Would like call to confirm when sent. [phone].

## 2023-09-29 NOTE — Telephone Encounter (Signed)
 I sent in one refill. That should last until she gets in at [facility] in June. Please let [name] know. Thanks, Inetta Fermo

## 2023-09-30 NOTE — Telephone Encounter (Signed)
 Attempted to contact patient. Patient unable to be reached. LVM to call back if need be.  SS, CCMA

## 2023-10-17 NOTE — Progress Notes (Unsigned)
 Tamara [name]   MRN:  [id number]  [birth year]   Provider: Elveria Rising NP-C Location of Care: [facility]  Visit type: Return visit  Last visit: 03/16/2023  Referral source: Dulce Sellar, NP History from: Epic chart and patient  Brief history:  Copied from previous record: "Tamara [name]" has a generalized convulsive epilepsy with myoclonus.  She also has history of Tamara [name] Chiari malformation type 1, migraine without aura, episodic tension type headaches and obesity. She has some problems with mood that is managed by another provider.   Today's concerns: She reports today that she has remained seizure free since her last visit She reports problems going to sleep and staying asleep at times. 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 She does not work overnight.  She has some headaches and migraines when she has not slept well the night before. She has been referred to an adult neurology provider for transfer of care but does not have an appointment there until June 2025 Tamara [name] has been otherwise generally healthy since she was last seen. No health concerns today other than previously mentioned.  Review of systems: Please see HPI for neurologic and other pertinent review of systems. Otherwise all other systems were reviewed and were negative.  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  The tonsils were rounded and present no long-term risk to this patient.  There is no crowding in the posterior fossa as result of this cerebellar configuration.   August 05, 2014 She felt her left arm going up to her chest and was unable to put it back down and then had no memory for other events.  Her brother and mother witnessed generalized tonic-clonic seizure activity with stiffening of her upper extremities or head going back, eyes rolling upwards lasting 2 to 3 minutes with resolution.  In the aftermath, she was groggy and postictal.  In the emergency department, she had a normal neurological examination and recovered her cognitive abilities.  She had a normal EKG.     EEG performed August 07, 2014, was an essentially normal record with the patient awake and drowsy, dominant frequency with lower limits of normal.  She had a normal background activity with drowsiness and no evidence of interictal activity.   An EEG was performed on September 13, 2015 and was a normal study in the waking state and drowsy.    She was averaging 3 or 4 migraines per month on the calendars sent to [place] since her last visit.   Birth History 8 lbs. 0 oz. infant  born at [redacted] weeks gestational age to a 26 year old g 2 p 1 0 0 1 female. Gestation was uncomplicated Mother received Epidural anesthesia   Normal spontaneous vaginal delivery Nursery Course was uncomplicated Growth and Development was recalled as normal  Surgical history: Past Surgical History:  Procedure Laterality Date   NO PAST SURGERIES       Family history: family history includes  Cancer in her paternal grandmother; Heart attack in her maternal grandfather.   Social history: Social History   Socioeconomic History   Marital status: Single    Spouse name: Not on file   Number of children: Not on file   Years of education: Not on file   Highest education level: Not on file  Occupational History   Not on file  Tobacco Use   Smoking status: Never    Passive exposure: Yes   Smokeless tobacco: Never  Substance and Sexual Activity   Alcohol use: No    Alcohol/week: 0.0 standard drinks of alcohol   Drug use: No   Sexual activity: Never  Other Topics Concern   Not on file  Social History Narrative   Kina is high school gaduate.   She attended [employer] at home.    She lives with her parents. She has two brothers.   She enjoys listening to music and reading.   Social Drivers of Corporate investment banker Strain: Not on file  Food Insecurity: Not on file  Transportation Needs: Not on file  Physical Activity: Not on file  Stress: Not on file  Social Connections: Not on file  Intimate Partner Violence: Not on file    Past/failed meds: Copied from previous record: Qudexy for migraine prophylaxis - cost   Allergies: No Known Allergies   Immunizations:  There is no immunization history on file for this patient.   Diagnostics/Screenings: Copied from previous record: CT scan of the brain suggested that she had low-lying cerebellar tonsils.  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  The tonsils were rounded and present no long-term risk to this patient.  There is no crowding in the posterior fossa as result of this cerebellar configuration.   EEG performed August 07, 2014, was an essentially normal record with the patient awake and drowsy, dominant frequency with lower limits of normal.  She had a normal background activity with drowsiness and no evidence of interictal activity.    An EEG was performed on September 13, 2015 and was a normal study in the waking state and drowsy.   Physical Exam: BP 100/62 (BP Location: Left Arm, Patient Position: Sitting, Cuff Size: Large)   Pulse 60   Ht 5' 6.85" (1.698 m)   Wt 238 lb (108 kg) Comment: Taken twice  LMP 10/06/2023 (Exact Date)   BMI 37.44 kg/m   General: Well developed, well nourished young woman, seated on exam table, in no evident distress Head: Head normocephalic and atraumatic.  Oropharynx benign. Neck: Supple Cardiovascular: Regular rate and rhythm, no murmurs Respiratory: Breath sounds clear to auscultation Musculoskeletal: No obvious deformities or scoliosis Skin: No rashes or neurocutaneous lesions  Neurologic Exam Mental Status: Awake and fully alert.  Oriented to place and time.  Recent and remote memory intact.  Attention span, concentration, and fund of knowledge appropriate.  Mood and affect appropriate. Cranial Nerves: Fundoscopic exam reveals sharp disc margins.  Pupils equal, briskly reactive to light.  Extraocular movements full without  nystagmus. Hearing intact and symmetric to whisper.  Facial sensation intact.  Face tongue, palate move normally and symmetrically. Shoulder shrug normal Motor: Normal bulk and tone. Normal strength in all tested extremity muscles. Sensory: Intact to touch and temperature in all extremities.  Coordination: Rapid alternating movements normal in all extremities.  Finger-to-nose and heel-to shin performed accurately bilaterally.  Romberg negative. Gait and Station: Arises from chair without difficulty.  Stance is normal. Gait demonstrates normal stride length and balance.   Able to heel, toe and tandem walk without difficulty. Reflexes: 1+ and symmetric. Toes downgoing.   Impression: Epilepsy, generalized, convulsive (HCC) - Plan: levETIRAcetam (KEPPRA) 500 MG tablet  Sleep initiation dysfunction - Plan: hydrOXYzine (ATARAX) 10 MG tablet  Myoclonus - Plan:  levETIRAcetam (KEPPRA) 500 MG tablet  Migraine without aura and without status migrainosus, not intractable - Plan: SUMAtriptan (IMITREX) 50 MG tablet, topiramate (TOPAMAX) 25 MG tablet  Episodic tension-type headache, not intractable  Arnold-Chiari malformation, type I (HCC)   Recommendations for plan of care: The patient's previous Epic records were reviewed. No recent diagnostic studies to be reviewed with the patient. I talked with Tamara [name] about her difficulty with sleep and explained about sleep hygiene. I explained that headaches and seizures can be triggered by poor sleep and recommended a trial of Hydroxyzine along with sleep hygiene measures to improve her sleep.   Plan until next visit: Start Hydroxyzine at bedtime Work on sleep hygiene Continue other medications as prescribed  Call for questions or concerns Keep appointment with Dr Terrace Arabia in June for transfer of care to adult neurology provider.   The medication list was reviewed and reconciled. I reviewed the changes that were made in the prescribed medications today. A complete medication list was provided to the patient.  Allergies as of 10/18/2023   No Known Allergies      Medication List        Accurate as of October 18, 2023 12:33 PM. If you have any questions, ask your nurse or doctor.          escitalopram 10 MG tablet Commonly known as: LEXAPRO Take 10 mg by mouth daily.   hydrOXYzine 10 MG tablet Commonly known as: ATARAX Take 1 tablet at bedtime as needed for sleep Started by: Elveria Rising   ibuprofen 400 MG tablet Commonly known as: ADVIL Take 1 tablet at onset of headache. May repeat in 6 hours if headache persists   levETIRAcetam 500 MG tablet Commonly known as: Keppra Take 1 tablet (500 mg total) by mouth 2 (two) times daily.   Nayzilam 5 MG/0.1ML Soln Generic drug: Midazolam Place 1 spray in to 1 nostril for seizures lasting 2 minutes or longer   SUMAtriptan 50 MG tablet Commonly known  as: IMITREX Take 1 tablet at onset of migraine along with Ibuprofen 400mg . May repeat in 2 hours if headache persists or recurs.   topiramate 25 MG tablet Commonly known as: TOPAMAX Take 2 tablets at nighttime      Total time spent with the patient was 30 minutes, of which 50% or more was spent in counseling and coordination of care.  Elveria Rising NP-C [facility] [address] [place], [place] [address] Ph. [phone] Fax [phone]

## 2023-10-18 ENCOUNTER — Encounter (INDEPENDENT_AMBULATORY_CARE_PROVIDER_SITE_OTHER): Payer: Self-pay | Admitting: Family

## 2023-10-18 ENCOUNTER — Ambulatory Visit (INDEPENDENT_AMBULATORY_CARE_PROVIDER_SITE_OTHER): Admitting: Family

## 2023-10-18 VITALS — BP 100/62 | HR 60 | Ht 66.85 in | Wt 238.0 lb

## 2023-10-18 DIAGNOSIS — G253 Myoclonus: Secondary | ICD-10-CM

## 2023-10-18 DIAGNOSIS — G44219 Episodic tension-type headache, not intractable: Secondary | ICD-10-CM

## 2023-10-18 DIAGNOSIS — G935 Compression of brain: Secondary | ICD-10-CM

## 2023-10-18 DIAGNOSIS — G43009 Migraine without aura, not intractable, without status migrainosus: Secondary | ICD-10-CM | POA: Diagnosis not present

## 2023-10-18 DIAGNOSIS — G4700 Insomnia, unspecified: Secondary | ICD-10-CM | POA: Diagnosis not present

## 2023-10-18 DIAGNOSIS — G40309 Generalized idiopathic epilepsy and epileptic syndromes, not intractable, without status epilepticus: Secondary | ICD-10-CM | POA: Diagnosis not present

## 2023-10-18 MED ORDER — HYDROXYZINE HCL 10 MG PO TABS: ORAL_TABLET | ORAL | 3 refills | Status: DC

## 2023-10-18 MED ORDER — TOPIRAMATE 25 MG PO TABS: ORAL_TABLET | ORAL | 1 refills | Status: DC

## 2023-10-18 MED ORDER — LEVETIRACETAM 500 MG PO TABS: 500.0000 mg | ORAL_TABLET | Freq: Two times a day (BID) | ORAL | 0 refills | Status: DC

## 2023-10-18 MED ORDER — SUMATRIPTAN SUCCINATE 50 MG PO TABS: ORAL_TABLET | ORAL | 3 refills | Status: DC

## 2023-10-18 NOTE — Patient Instructions (Addendum)
 It was a pleasure to see you today!  Instructions for you until your next appointment are as follows: Start Hydroxyzine 10mg  at bedtime as needed for sleep.  Along with the Hydroxyzine, work on developing a sleep routine, going to bed at the same time each night, and limit daytime naps to less than one hour. Never nap after 3pm as this can alter your sleep cycle.  Check out this website for other recommendations about getting good sleep each night - https://www.sleepfoundation.org/sleep-hygiene Continue your other medications as prescribed Be sure to keep the June appointment with Dr Terrace Arabia at [facility]. I will manage all refills and concerns until you get established there in June Please sign up for MyChart if you have not done so.  Feel free to contact our office during normal business hours at [phone] with questions or concerns. If there is no answer or the call is outside business hours, please leave a message and our clinic staff will call you back within the next business day.  If you have an urgent concern, please stay on the line for our after-hours answering service and ask for the on-call neurologist.     I also encourage you to use MyChart to communicate with me more directly. If you have not yet signed up for MyChart within [facility], the front desk staff can help you. However, please note that this inbox is NOT monitored on nights or weekends, and response can take up to 2 business days.  Urgent matters should be discussed with the on-call pediatric neurologist.   At [facility], we are committed to providing exceptional care. You will receive a patient satisfaction survey through text or email regarding your visit today. Your opinion is important to me. Comments are appreciated.

## 2023-11-09 ENCOUNTER — Other Ambulatory Visit (INDEPENDENT_AMBULATORY_CARE_PROVIDER_SITE_OTHER): Payer: Self-pay | Admitting: Family

## 2023-11-09 DIAGNOSIS — G4700 Insomnia, unspecified: Secondary | ICD-10-CM

## 2024-01-17 ENCOUNTER — Ambulatory Visit: Admitting: Neurology

## 2024-01-17 ENCOUNTER — Telehealth: Payer: Self-pay | Admitting: Neurology

## 2024-01-17 NOTE — Telephone Encounter (Signed)
 Pt had to cx, car wont start

## 2024-01-26 ENCOUNTER — Emergency Department (HOSPITAL_COMMUNITY)
Admission: EM | Admit: 2024-01-26 | Discharge: 2024-01-26 | Discharge status: Against Medical Advice | Payer: Self-pay | Attending: Emergency Medicine | Admitting: Emergency Medicine

## 2024-01-26 DIAGNOSIS — R109 Unspecified abdominal pain: Secondary | ICD-10-CM | POA: Diagnosis present

## 2024-01-26 DIAGNOSIS — R197 Diarrhea, unspecified: Secondary | ICD-10-CM | POA: Diagnosis not present

## 2024-01-26 DIAGNOSIS — Z5321 Procedure and treatment not carried out due to patient leaving prior to being seen by health care provider: Secondary | ICD-10-CM | POA: Diagnosis not present

## 2024-01-26 DIAGNOSIS — R112 Nausea with vomiting, unspecified: Secondary | ICD-10-CM | POA: Diagnosis not present

## 2024-01-26 NOTE — ED Triage Notes (Signed)
 Pt here from home with c/o abd pain a along with n/v/d , pt was seen at [facility] yesterday and given fluids , states that she still feels  bad

## 2024-02-17 ENCOUNTER — Ambulatory Visit: Admitting: Neurology

## 2024-02-17 ENCOUNTER — Encounter: Payer: Self-pay | Admitting: Neurology

## 2024-02-17 VITALS — BP 103/64 | HR 57 | Ht 66.0 in | Wt 236.0 lb

## 2024-02-17 DIAGNOSIS — G43009 Migraine without aura, not intractable, without status migrainosus: Secondary | ICD-10-CM

## 2024-02-17 DIAGNOSIS — G40909 Epilepsy, unspecified, not intractable, without status epilepticus: Secondary | ICD-10-CM

## 2024-02-17 MED ORDER — ONDANSETRON 4 MG PO TBDP: 4.0000 mg | ORAL_TABLET | Freq: Three times a day (TID) | ORAL | 6 refills | Status: AC | PRN

## 2024-02-17 MED ORDER — TOPIRAMATE 100 MG PO TABS
100.0000 mg | ORAL_TABLET | Freq: Two times a day (BID) | ORAL | 11 refills | Status: DC
Start: 2024-02-17 — End: 2024-04-24

## 2024-02-17 MED ORDER — SUMATRIPTAN SUCCINATE 100 MG PO TABS: ORAL_TABLET | ORAL | 11 refills | Status: AC

## 2024-02-17 NOTE — Progress Notes (Signed)
 Chief Complaint  Patient presents with   New Patient (Initial Visit)    Rm14, mother present, Internal referral for headaches, seizure disorder - hx of Arnold Chiari type 1 malformation - TOC to adult neurology: last sz 2019. Has: 8/30 days, migraines: 4:30 days, TRIGGERS: stress/sleep/sound/lights. Pt stated that she has had balance issues, speech issues: slurred and wrong words coming out      ASSESSMENT AND PLAN  Tamara [name] is a 28 y.o. female   History of seizure, last seizure-like spell was in 2019 Frequent migraine headaches Depression anxiety  Will try to simplify medications, higher dose of Topamax  100 mg twice a day, taper off Keppra   Imitrex  100 mg as needed, may combine with Zofran , ibuprofen  for severe prolonged headaches  Return in 6 months  DIAGNOSTIC DATA (LABS, IMAGING, TESTING) - I reviewed patient records, labs, notes, testing and imaging myself where available.   MEDICAL HISTORY:  Tamara [name] is a 28 year old female, seen in request by her primary care NP Lucius Krabbe, for evaluation of headache, seizure, transition from pediatric neurology to adult, accompanied by her mother at today's visit February 17, 2024  History is obtained from the patient and review of electronic medical records. I personally reviewed pertinent available imaging films in PACS.   PMHx of  Depression, anxiety Seizure,   She has been under the care of pediatric neurologist Dr. Patrick and nurse practitioner Ellouise Falconer,  Had a history of generalized convulsive epilepsy with myoclonus, started at age 16, mother described generalized clonic seizure activity, had 2 normal EEG in January 2016 and February 2017  She was treated with Keppra  500 mg twice daily, had not had recurrent episode since 2019  She had long history of chronic migraine, since high school, now about twice a week described left lateralized retro-orbital area severe pounding headache with light, noise  sensitivity, nauseous, lasting for hours or longer,  Trigger for her migraine and stress, bright light, loud sound, prolonged screen time, missing the meal, dehydration,  Imitrex  50 mg as needed was helpful, she is also taking Topamax  25 mg only as needed basis  She works at [employer], do have weight issues, BMI 38  MRI of the brain without contrast October 2021, 4 to 5 mm cerebellar tonsillar ectopia, otherwise unremarkable, this is stable compared to previous MRI in 2016 and 2018  PHYSICAL EXAM:   Vitals:   02/17/24 0931 02/17/24 0938  BP: 99/70 103/64  Pulse: (!) 57   Weight: 236 lb (107 kg)   Height: 5' 6 (1.676 m)    Body mass index is 38.09 kg/m.  PHYSICAL EXAMNIATION:  Gen: NAD, conversant, well nourised, well groomed                     Cardiovascular: Regular rate rhythm, no peripheral edema, warm, nontender. Eyes: Conjunctivae clear without exudates or hemorrhage Neck: Supple, no carotid bruits. Pulmonary: Clear to auscultation bilaterally   NEUROLOGICAL EXAM:  MENTAL STATUS: Speech/cognition: Awake, alert, oriented to history taking and casual conversation CRANIAL NERVES: CN II: Visual fields are full to confrontation. Pupils are round equal and briskly reactive to light.  Funduscopy examination were normal bilaterally CN III, IV, VI: extraocular movement are normal. No ptosis. CN V: Facial sensation is intact to light touch CN VII: Face is symmetric with normal eye closure  CN VIII: Hearing is normal to causal conversation. CN IX, X: Phonation is normal. CN XI: Head turning and shoulder shrug are  intact  MOTOR: There is no pronator drift of out-stretched arms. Muscle bulk and tone are normal. Muscle strength is normal.  REFLEXES: Reflexes are 2+ and symmetric at the biceps, triceps, knees, and ankles. Plantar responses are flexor.  SENSORY: Intact to light touch, pinprick and vibratory sensation are intact in fingers and  toes.  COORDINATION: There is no trunk or limb dysmetria noted.  GAIT/STANCE: Posture is normal. Gait is steady   REVIEW OF SYSTEMS:  Full 14 system review of systems performed and notable only for as above All other review of systems were negative.   ALLERGIES: No Known Allergies  HOME MEDICATIONS: Current Outpatient Medications  Medication Sig Dispense Refill   escitalopram (LEXAPRO) 10 MG tablet Take 10 mg by mouth daily.     hydrOXYzine  (ATARAX ) 10 MG tablet TAKE 1 TABLET BY MOUTH EVERY DAY AT BEDTIME AS NEEDED FOR SLEEP 90 tablet 0   ibuprofen  (ADVIL ) 400 MG tablet Take 1 tablet at onset of headache. May repeat in 6 hours if headache persists 30 tablet 0   levETIRAcetam  (KEPPRA ) 500 MG tablet Take 1 tablet (500 mg total) by mouth 2 (two) times daily. 180 tablet 0   NAYZILAM  5 MG/0.1ML SOLN Place 1 spray in to 1 nostril for seizures lasting 2 minutes or longer 2 each 0   SUMAtriptan  (IMITREX ) 50 MG tablet Take 1 tablet at onset of migraine along with Ibuprofen  400mg . May repeat in 2 hours if headache persists or recurs. 10 tablet 3   topiramate  (TOPAMAX ) 25 MG tablet Take 2 tablets at nighttime 180 tablet 1   No current facility-administered medications for this visit.    PAST MEDICAL HISTORY: Past Medical History:  Diagnosis Date   Brain mass    Migraines    Seizures (HCC)     PAST SURGICAL HISTORY: Past Surgical History:  Procedure Laterality Date   NO PAST SURGERIES      FAMILY HISTORY: Family History  Problem Relation Age of Onset   Heart attack Maternal Grandfather        Died at 40   Cancer Paternal Grandmother        Age at time of death is unknown    SOCIAL HISTORY: Social History   Socioeconomic History   Marital status: Single    Spouse name: Not on file   Number of children: Not on file   Years of education: Not on file   Highest education level: Not on file  Occupational History   Not on file  Tobacco Use   Smoking status: Never     Passive exposure: Yes   Smokeless tobacco: Never  Substance and Sexual Activity   Alcohol use: No    Alcohol/week: 0.0 standard drinks of alcohol   Drug use: No   Sexual activity: Never  Other Topics Concern   Not on file  Social History Narrative   Saralynn is high school gaduate.   She lives with her parents. She has two brothers.   She enjoys listening to music and reading.   Social Drivers of Corporate investment banker Strain: Not on file  Food Insecurity: Not on file  Transportation Needs: Not on file  Physical Activity: Not on file  Stress: Not on file  Social Connections: Not on file  Intimate Partner Violence: Not on file      Modena Callander, M.D. Ph.D.  [facility] [address], [address] [place], [place] [address] Ph: [phone] Fax: [phone]  CC:  Marianna City,  NP [address] [place],  [place] [address]  Lucius Krabbe, NP

## 2024-02-17 NOTE — Patient Instructions (Signed)
 Week Topamax  100mg  AM/PM Keppra  500mg   1st week 1/1 0/1  2nd week 1/1 0/0   Meds ordered this encounter  Medications   SUMAtriptan  (IMITREX ) 100 MG tablet    Sig: Take 1 tablet at onset of migraine along with Ibuprofen  400mg . May repeat in 2 hours if headache persists or recurs.    Dispense:  12 tablet    Refill:  11   ondansetron  (ZOFRAN -ODT) 4 MG disintegrating tablet    Sig: Take 1 tablet (4 mg total) by mouth every 8 (eight) hours as needed.    Dispense:  20 tablet    Refill:  6   topiramate  (TOPAMAX ) 100 MG tablet    Sig: Take 1 tablet (100 mg total) by mouth 2 (two) times daily.    Dispense:  60 tablet    Refill:  11

## 2024-04-03 ENCOUNTER — Other Ambulatory Visit (INDEPENDENT_AMBULATORY_CARE_PROVIDER_SITE_OTHER): Payer: Self-pay | Admitting: Family

## 2024-04-03 DIAGNOSIS — G40309 Generalized idiopathic epilepsy and epileptic syndromes, not intractable, without status epilepticus: Secondary | ICD-10-CM

## 2024-04-03 DIAGNOSIS — G253 Myoclonus: Secondary | ICD-10-CM

## 2024-04-18 ENCOUNTER — Telehealth: Payer: Self-pay | Admitting: Neurology

## 2024-04-18 NOTE — Telephone Encounter (Signed)
 Pt is asking to be called, she does not recall how Dr Onita advised her to wean herself off of the levETIRAcetam  (KEPPRA ) 500 MG tablet

## 2024-04-19 ENCOUNTER — Encounter: Payer: Self-pay | Admitting: Neurology

## 2024-04-19 DIAGNOSIS — G40309 Generalized idiopathic epilepsy and epileptic syndromes, not intractable, without status epilepticus: Secondary | ICD-10-CM

## 2024-04-19 DIAGNOSIS — G253 Myoclonus: Secondary | ICD-10-CM

## 2024-04-24 NOTE — Addendum Note (Signed)
 Addended by: JOSHUA IZETTA CROME on: 04/24/2024 10:17 AM   Modules accepted: Orders

## 2024-05-08 MED ORDER — LEVETIRACETAM 500 MG PO TABS: 500.0000 mg | ORAL_TABLET | Freq: Two times a day (BID) | ORAL | 1 refills | Status: AC

## 2024-05-08 NOTE — Addendum Note (Signed)
 Addended by: ONEITA NEVELYN BRAVO on: 05/08/2024 08:07 AM   Modules accepted: Orders

## 2024-06-22 ENCOUNTER — Telehealth: Payer: Self-pay | Admitting: Adult Health

## 2024-06-22 NOTE — Telephone Encounter (Signed)
 [facility] cxl

## 2024-08-28 ENCOUNTER — Ambulatory Visit: Admitting: Adult Health
